# Patient Record
Sex: Female | Born: 1945 | Race: White | Hispanic: No | Marital: Married | Smoking: Never smoker
Health system: Southern US, Community
[De-identification: ages and names within clinical notes are randomized; demographics above are authoritative.]

## PROBLEM LIST (undated history)

## (undated) DIAGNOSIS — K649 Unspecified hemorrhoids: Secondary | ICD-10-CM

## (undated) DIAGNOSIS — T7840XA Allergy, unspecified, initial encounter: Secondary | ICD-10-CM

## (undated) DIAGNOSIS — E785 Hyperlipidemia, unspecified: Secondary | ICD-10-CM

## (undated) DIAGNOSIS — K219 Gastro-esophageal reflux disease without esophagitis: Secondary | ICD-10-CM

## (undated) DIAGNOSIS — I10 Essential (primary) hypertension: Secondary | ICD-10-CM

## (undated) DIAGNOSIS — C801 Malignant (primary) neoplasm, unspecified: Secondary | ICD-10-CM

## (undated) DIAGNOSIS — G4730 Sleep apnea, unspecified: Secondary | ICD-10-CM

## (undated) DIAGNOSIS — M8580 Other specified disorders of bone density and structure, unspecified site: Secondary | ICD-10-CM

## (undated) DIAGNOSIS — K5790 Diverticulosis of intestine, part unspecified, without perforation or abscess without bleeding: Secondary | ICD-10-CM

## (undated) DIAGNOSIS — R7303 Prediabetes: Secondary | ICD-10-CM

## (undated) DIAGNOSIS — M1990 Unspecified osteoarthritis, unspecified site: Secondary | ICD-10-CM

## (undated) HISTORY — DX: Gastro-esophageal reflux disease without esophagitis: K21.9

## (undated) HISTORY — PX: NASAL SEPTUM SURGERY: SHX37

## (undated) HISTORY — DX: Unspecified hemorrhoids: K64.9

## (undated) HISTORY — DX: Hyperlipidemia, unspecified: E78.5

## (undated) HISTORY — DX: Diverticulosis of intestine, part unspecified, without perforation or abscess without bleeding: K57.90

## (undated) HISTORY — DX: Sleep apnea, unspecified: G47.30

## (undated) HISTORY — DX: Allergy, unspecified, initial encounter: T78.40XA

## (undated) HISTORY — PX: ABDOMINAL HYSTERECTOMY: SUR658

## (undated) HISTORY — DX: Essential (primary) hypertension: I10

## (undated) HISTORY — DX: Unspecified osteoarthritis, unspecified site: M19.90

## (undated) HISTORY — DX: Other specified disorders of bone density and structure, unspecified site: M85.80

## (undated) HISTORY — PX: NASOPHARYNGEAL STENOSIS REPAIR: SHX2075

---

## 2002-10-23 ENCOUNTER — Other Ambulatory Visit: Admission: RE | Admit: 2002-10-23 | Discharge: 2002-10-23 | Payer: Self-pay | Admitting: *Deleted

## 2003-03-18 ENCOUNTER — Ambulatory Visit (HOSPITAL_COMMUNITY): Admission: RE | Admit: 2003-03-18 | Discharge: 2003-03-18 | Payer: Self-pay | Admitting: *Deleted

## 2003-05-24 ENCOUNTER — Encounter (INDEPENDENT_AMBULATORY_CARE_PROVIDER_SITE_OTHER): Payer: Self-pay | Admitting: Specialist

## 2003-05-24 ENCOUNTER — Inpatient Hospital Stay (HOSPITAL_COMMUNITY): Admission: RE | Admit: 2003-05-24 | Discharge: 2003-05-29 | Payer: Self-pay | Admitting: Surgery

## 2003-05-24 HISTORY — PX: OTHER SURGICAL HISTORY: SHX169

## 2007-07-24 DIAGNOSIS — K5790 Diverticulosis of intestine, part unspecified, without perforation or abscess without bleeding: Secondary | ICD-10-CM

## 2007-07-24 HISTORY — DX: Diverticulosis of intestine, part unspecified, without perforation or abscess without bleeding: K57.90

## 2007-07-24 HISTORY — PX: COLONOSCOPY: SHX174

## 2008-05-27 ENCOUNTER — Encounter: Payer: Self-pay | Admitting: Internal Medicine

## 2008-06-15 ENCOUNTER — Ambulatory Visit: Payer: Self-pay | Admitting: Internal Medicine

## 2008-06-30 ENCOUNTER — Ambulatory Visit: Payer: Self-pay | Admitting: Internal Medicine

## 2008-07-01 ENCOUNTER — Telehealth: Payer: Self-pay | Admitting: Internal Medicine
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
## 2010-12-08 NOTE — Discharge Summary (Signed)
NAMETRINE, FREAD                       ACCOUNT NO.:  [id number]   MEDICAL RECORD NO.:  [id number]                   PATIENT TYPE:  INP   LOCATION:  0379                                 FACILITY:  [facility]   PHYSICIAN:  Thornton Park. Daphine Deutscher, M.D.             DATE OF BIRTH:  1946-02-06   DATE OF ADMISSION:  05/24/2003  DATE OF DISCHARGE:  05/29/2003                                 DISCHARGE SUMMARY   ADMISSION DIAGNOSIS:  colovaginal fistula.   DISCHARGE DIAGNOSIS:  colovaginal fistula.   PROCEDURE:  Laparotomy, take down of colovaginal fistula, colon resection,  primary anastomosis.   HOSPITAL COURSE:  Lyan Holck was an a.m. admission and underwent the  above mentioned operation. She got along well and by postoperative day five  was doing great. She was taking food and passing gas and was eager to go  home. Her Jackson-Pratt drain which had been placed at the time of surgery  was removed. She was instructed to return to the office in about 2-3 weeks.   CONDITION ON DISCHARGE:  Improved.   FINAL DIAGNOSIS:  Status post take down of colovaginal fistula.                                               Thornton Park Daphine Deutscher, M.D.    MBM/MEDQ  D:  06/21/2003  T:  06/21/2003  Job:  [id number]   cc:   Lina Sar, M.D. [facility]   Pershing Cox, M.D.  [address]  [address]  [place]  [place] [address]  Fax: [phone]   Tinnie Gens ______________, Judie Petit.D.

## 2011-09-20 ENCOUNTER — Other Ambulatory Visit: Payer: Self-pay | Admitting: Dermatology

## 2011-12-03 ENCOUNTER — Encounter: Payer: Self-pay | Admitting: *Deleted

## 2013-06-29 ENCOUNTER — Ambulatory Visit (INDEPENDENT_AMBULATORY_CARE_PROVIDER_SITE_OTHER): Payer: Medicare Other | Admitting: Psychiatry

## 2013-06-29 ENCOUNTER — Encounter (INDEPENDENT_AMBULATORY_CARE_PROVIDER_SITE_OTHER): Payer: Self-pay

## 2013-06-29 DIAGNOSIS — F4323 Adjustment disorder with mixed anxiety and depressed mood: Secondary | ICD-10-CM

## 2013-06-29 DIAGNOSIS — F1020 Alcohol dependence, uncomplicated: Secondary | ICD-10-CM

## 2013-06-29 DIAGNOSIS — F1910 Other psychoactive substance abuse, uncomplicated: Secondary | ICD-10-CM

## 2013-06-29 DIAGNOSIS — F329 Major depressive disorder, single episode, unspecified: Secondary | ICD-10-CM

## 2013-06-30 ENCOUNTER — Encounter (HOSPITAL_COMMUNITY): Payer: Self-pay | Admitting: Psychiatry

## 2013-06-30 DIAGNOSIS — F1020 Alcohol dependence, uncomplicated: Secondary | ICD-10-CM | POA: Insufficient documentation

## 2013-06-30 DIAGNOSIS — F329 Major depressive disorder, single episode, unspecified: Secondary | ICD-10-CM | POA: Insufficient documentation
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
## 2013-07-09 ENCOUNTER — Encounter (HOSPITAL_COMMUNITY): Payer: Self-pay | Admitting: Psychology

## 2013-07-10 ENCOUNTER — Other Ambulatory Visit (HOSPITAL_COMMUNITY): Payer: Medicare Other | Attending: Psychiatry | Admitting: Psychology

## 2013-07-10 DIAGNOSIS — F1020 Alcohol dependence, uncomplicated: Secondary | ICD-10-CM | POA: Insufficient documentation

## 2013-07-12 ENCOUNTER — Encounter (HOSPITAL_COMMUNITY): Payer: Self-pay | Admitting: Psychology

## 2013-07-12 DIAGNOSIS — F10230 Alcohol dependence with withdrawal, uncomplicated: Secondary | ICD-10-CM | POA: Insufficient documentation

## 2013-07-12 NOTE — Progress Notes (Signed)
    Daily Group Progress Note  Program: CD-IOP   Group Time: 1-2:30 pm  Participation Level: Minimal  Behavioral Response: Appropriate and Sharing  Type of Therapy: Process Group  Topic: Group Process: the first part of group was spent in process./ members shared about current issues and concerns in early recovery. During this time, the medical director met with two new group members as well as three current group members about ongoing issues/medications/discharge.   Group Time: 2:45- 4pm  Participation Level: Minimal  Behavioral Response: Sharing  Type of Therapy: Psycho-education Group  Topic: Psycho-Ed: Self-Esteem/Graduation. The second half of group was spent in a psycho-ed session on self-esteem. The importance of living one's values was emphasized. An activity was also included where members wrote a word on a piece of paper taped to each group members' backs. Then they were asked to read the words their fellow members had written to describe them. At the end of the session,. A graduation ceremony was held honoring a member who is leaving. It was an intense group and the ending proved very heartfelt with a fond farewell from the members who remain.   Summary: The patient was new to the group and was quite, but very attentive in the first half of group. She agreed to introduce herself briefly in the second half of group. She described herself as having been an alcoholic for a number of years. The patient noted her husband drinks more than she does. She got teary as she described her husband's demeaning words towards her, especially in the evenings, after he has been drinking. She reported her mother had died of cirrhosis at age 38. The patient read the words her new group members had written and they included: beautiful, brave, and kind. She admitted that she had enjoyed the session and although she is excused from [facility] group due to a previous commitment, she will be back on  Wednesday morning. The new patient's sobriety date is 12/18.    Family Program: Family present? No   Name of family member(s):   UDS collected: No Results:  AA/NA attended?: No, not recently, but she had attended a meeting years ago - "full of old men"  Sponsor?: No, she is new to the program   Ziyana Morikawa, LCAS

## 2013-07-13 ENCOUNTER — Other Ambulatory Visit (HOSPITAL_COMMUNITY): Payer: Medicare Other

## 2013-07-13 ENCOUNTER — Encounter (HOSPITAL_COMMUNITY): Payer: Self-pay | Admitting: Psychology

## 2013-07-14 NOTE — Progress Notes (Unsigned)
Patient ID: [name], female   DOB: [birth year], 67 y.o.   MRN: [id number] CD-IOP: The patient did not appear for group today. She had told me on Friday that she had already committed to baby sitting her grandchildren while her daughter does some Christmas shopping. She will be excused from group because of this commitment planned before she had ever sought treatment for her drinking.
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
## 2013-07-15 ENCOUNTER — Other Ambulatory Visit (HOSPITAL_COMMUNITY): Payer: Medicare Other | Admitting: Psychology

## 2013-07-15 DIAGNOSIS — F1020 Alcohol dependence, uncomplicated: Secondary | ICD-10-CM
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
## 2013-07-17 ENCOUNTER — Other Ambulatory Visit (HOSPITAL_COMMUNITY): Payer: Medicare Other

## 2013-07-18 ENCOUNTER — Encounter (HOSPITAL_COMMUNITY): Payer: Self-pay | Admitting: Psychology
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
## 2013-07-20 ENCOUNTER — Other Ambulatory Visit (HOSPITAL_COMMUNITY): Payer: Medicare Other | Admitting: Psychology

## 2013-07-20 DIAGNOSIS — F329 Major depressive disorder, single episode, unspecified: Secondary | ICD-10-CM

## 2013-07-20 DIAGNOSIS — F1020 Alcohol dependence, uncomplicated: Secondary | ICD-10-CM

## 2013-07-22 ENCOUNTER — Encounter (HOSPITAL_COMMUNITY): Payer: Self-pay | Admitting: Psychology

## 2013-07-22 ENCOUNTER — Other Ambulatory Visit (HOSPITAL_COMMUNITY): Payer: Medicare Other | Admitting: Psychology

## 2013-07-22 DIAGNOSIS — F1020 Alcohol dependence, uncomplicated: Secondary | ICD-10-CM

## 2013-07-22 DIAGNOSIS — F329 Major depressive disorder, single episode, unspecified: Secondary | ICD-10-CM

## 2013-07-22 NOTE — Progress Notes (Signed)
    Daily Group Progress Note  Program: CD-IOP   Group Time: 1-2:30 pm  Participation Level: Minimal  Behavioral Response: Appropriate and Sharing  Type of Therapy: Process Group  Topic: Group Process; the first part off group was spent in process. Members shared about the past holiday weekend and any issues or concerns they may have had in early recovery. One member admitted he had taken a percocet on Friday after spending the previous few days helping his daughter move into her new home. He pointed out that some people with chronic pain didn't consider taking a prescription medication properly a problem. He was very resistant and made many excuses. The group said little to challenge this member. Other than this member, everyone else reported their same sobriety date. Three members were absent from the group.   Group Time: 2:45- 4pm  Participation Level: Minimal  Behavioral Response: Sharing  Type of Therapy: Psycho-education Group  Topic:The Neurobiology of Addiction: the second half of group was spent in a psycho-ed session about brain chemistry and addiction. Members watched a brief film by a Well[employer] geologist of the [place]. In detailed, yet simple terms, he described what happens in the addict's brain as a result of drug use. The session proved very informative and members agreed that it made sense and helped them better understand their own struggles and journey into addiction. Today, drug tests were collected for all group members present.   Summary: The patient reported she had had a good holiday. Her children and their families had come to the house on Friday, because the young children like to be home on Christmas morning for [place]. She admitted that she had told her husband to "knock it off" after he had drunk 3 gin and tonics. The patient had reported in previous sessions that her husband has too much to drink and then he gets mean or angry and there have been verbal  altercations in the past. He did stop drinking after she said something to him and the dinner went well. When I questioned her, she admitted that her son-in-law didn't come to the house. He became estranged from the family after a run-in with her husband 3 years ago. As a result, he does not come to their home any longer. The patient admitted it is sad and awkward for her. She reported she did not have any cravings, but felt uncomfortable when her husband continues to drink in front of her. Her sobriety date remains 12/18.   Family Program: Family present? No   Name of family member(s):   UDS collected: Yes Results: results not back yet  AA/NA attended?: YesSaturday  Sponsor?: No   Declynn Lopresti, LCAS

## 2013-07-24 ENCOUNTER — Encounter (HOSPITAL_COMMUNITY): Payer: Self-pay | Admitting: Psychology

## 2013-07-24 ENCOUNTER — Other Ambulatory Visit (HOSPITAL_COMMUNITY): Payer: Medicare Other | Attending: Psychiatry | Admitting: Psychology

## 2013-07-24 DIAGNOSIS — F1020 Alcohol dependence, uncomplicated: Secondary | ICD-10-CM

## 2013-07-24 DIAGNOSIS — F32A Depression, unspecified: Secondary | ICD-10-CM

## 2013-07-24 DIAGNOSIS — F329 Major depressive disorder, single episode, unspecified: Secondary | ICD-10-CM

## 2013-07-24 NOTE — Progress Notes (Signed)
    Daily Group Progress Note  Program: CD-IOP   Group Time: 1-2:30 pm  Participation Level: Active  Behavioral Response: Sharing Type of Therapy: Psycho-education Group  Topic: Psycho-Ed: "Relapse Prevention Model". the first half of group was spent in a psycho-ed session. After completion of the check-in, members were provided a handout and the model of relapse prevention was provided. Members provided feedback as the handout was discussed. During this time, the medical director met with two members for discharge and another new group member. The session invited good discussion and feedback.   Group Time: 2:45- 4pm  Participation Level: Minimal  Behavioral Response: Sharing  Type of Therapy: Process Group  Topic: Psycho-Ed/Guided Relaxation/Graduation. The second half of group was spent in process. Members shared about current issues and concerns. During this session, a passive progressive relaxation exercise was read by this Probation officer while group members participated in the exercise. All members agreed that they had experienced a significant degree of relaxation and serenity. As the session came to a close, a graduation ceremony was held for a member successfully completing the program. There were kind words shared among the group for this admired member. She accepted their words with grace and appreciation. It was a powerful ending to this session.   Summary: The patient reported she had gone to [place] to celebrate the New Year with her husband a good long time friends. She noted she had passed on the [facility] and didn't experience any problems or questions because she hadn't drank anything. The patient reported she found that it was 'nice'. She admitted that she hasn't said anything to these women friends, but will take the opportunity to do that sometime . in the future. The patient attended the [place] meeting this morning and it proved, once again, very powerful. The patient reported  she had found this guided relaxation exercise very relaxing and she almost fell asleep. The patient reported she would continues to remain sober and is committed to her recovery. Her sobriety date remains 12/18.Chloe  Family Program: Family present? No   Name of family member(s):   UDS collected: No Results:   AA/NA attended?: YesFriday  Sponsor?: No, but she is new to the program   Rinnah Peppel, LCAS

## 2013-07-24 NOTE — Progress Notes (Signed)
    Daily Group Progress Note  Program: CD-IOP   Group Time: 1-2:30 pm  Participation Level: Active  Behavioral Response: Appropriate and Sharing  Type of Therapy: Process Group  Topic: Group Process: The first half of group was spent in process. Members shared about current issues and concerns in early recovery. A new group member was present and she introduced herself and shared about her struggles with alcohol. Also in included in this half of group was a handout and discussion on "Common Group Behaviors". The group read each of the 20 different behaviors in group that need to be confronted and challenged. It was helpful and invited members to call out other group members who are avoiding the truth in one way or another.   Group Time: 2:45- 4pm  Participation Level: Active  Behavioral Response: Sharing  Type of Therapy: Psycho-education Group  Topic: "Daily Inventory": the second half of group was spent in a psycho-ed on taking a daily inventory. Included was a handout listing personality characteristics of self-will or the addictive-self versus personality characteristics of one's HP will. Group members shared what they need to work on, including addressing the negative characteristics of their active addiction. They also shared the more desirable characteristics that they intend to focus more on in the coming days. The session proved very helpful and invited reflection and sharing. As the session ended, each member shared their plans for New Year's Eve and committed to what they will do tomorrow prior to the next group session.   Summary: The patient reported she had had a 'great experience' yesterday. She had attended an [place] meeting on Tuesday morning and collected some phone numbers from a number of the women at the meeting. The patient shared that she had called one of the women because she had been instructed to begin calling others by her counselor. The woman answered and admitted  that this phone call was the first one she had ever received from another woman in recovery despite being in [place] for 5 years. The patient reported they had spoken for quite a while and this other woman told her she would introduce her to some other women who shared similar stories. The patient was thrilled and had also attended the 10 am AA meeting today before group and it had been really good. In part two, the patient identified wanting to work on her "self-condemnation" and seek out the willingness to forgive herself. The patient is doing very well in early recovery and despite being uncomfortable, she has reached out as instructed and found it incredibly rewarding. Her sobriety date is 12/18.    Family Program: Family present? No   Name of family member(s):   UDS collected: No Results:  AA/NA attended?: YesTuesday and Wednesday  Sponsor?: No   Nieves Barberi, LCAS

## 2013-07-27 ENCOUNTER — Other Ambulatory Visit (HOSPITAL_COMMUNITY): Payer: Medicare Other | Admitting: Psychology

## 2013-07-27 DIAGNOSIS — F1020 Alcohol dependence, uncomplicated: Secondary | ICD-10-CM

## 2013-07-27 DIAGNOSIS — F32A Depression, unspecified: Secondary | ICD-10-CM

## 2013-07-27 DIAGNOSIS — F329 Major depressive disorder, single episode, unspecified: Secondary | ICD-10-CM

## 2013-07-28 ENCOUNTER — Encounter (HOSPITAL_COMMUNITY): Payer: Self-pay | Admitting: Psychology

## 2013-07-28 NOTE — Progress Notes (Signed)
    Daily Group Progress Note  Program: CD-IOP   Group Time: 1-2:30 pm  Participation Level: Minimal  Behavioral Response: Sharing  Type of Therapy: Process Group  Topic: Group Process: The first part of group was spent in process. Members shared about the past weekend and the things they had done to support their recovery. Almost everyone had attended a meeting and three members reported having secured a new sponsor. There was good disclosure and feedback among group members. During this group session, a drug test was collected from all of the group members.   Group Time: 2:45- 4pm  Participation Level: Minimal  Behavioral Response: Sharing  Type of Therapy: Psycho-education Group  Topic: Psycho-Ed/Graduation: the second half of group was spent in a psycho-ed session on one's "Person to Person Map". The handout consisted of a circle and members were asked to place themselves in the middle of it and then identify all of the people in their lives. Their closeness and size represented the importance each person holds for the patient. Each member then drew their circle on the board. As the session neared an end, a graduation ceremony was held to honor a member who was leaving the program. The medallion was passed around with members sharing their thoughts and hopes for the successfully graduating member. She is a highly regarded member who has made great progress and she urged the remaining members to be open and honest if they are to benefit from this program.   Summary: The patient reported she had not attended any meetings over the weekend, but she had gone to the [place] meeting at 10 am this morning. She reported she had not had any cravings. When she drew her wheel, the patient invited questions from other members. She admitted that despite a number of women friends, she had not told anyone that she was in this program. This included her two adult children. The patient, when questioned  further, admitted she didn't tell anyone because of being ashamed and embarrassed. Other members wondered if they wouldn't admit they had known if she told them, but the patient admitted she doesn't drink much around those same women. I pointed out she was much like her mother - a 'closet drinker' who died of cirrhosis. The patient seemed a little surprised by this comparison, but it was clearly accurate. I also reminded the group of the problems her husband had had with their son-in-law. They have had a problem now for almost 4 years and neither has entered the other's home in that same time. The patient admitted it was sad and her 42 yo daughter had wondered over Christmas why, "Daddy is not here"? after reviewing her map, it is clear that this woman is very closed and private with no one in her life really knowing what is going on with her. At age 68, this seemed very sad. The patient's sobriety date remains 12/18.    Family Program: Family present? No   Name of family member(s):   UDS collected: Yes Results: not back yet  AA/NA attended?: YesMonday  Sponsor?: No   Raeya Merritts, LCAS

## 2013-07-29 ENCOUNTER — Other Ambulatory Visit (HOSPITAL_COMMUNITY): Payer: Medicare Other | Admitting: Psychology

## 2013-07-29 DIAGNOSIS — F1020 Alcohol dependence, uncomplicated: Secondary | ICD-10-CM

## 2013-07-31 ENCOUNTER — Other Ambulatory Visit (HOSPITAL_COMMUNITY): Payer: Medicare Other

## 2013-08-03 ENCOUNTER — Other Ambulatory Visit (HOSPITAL_COMMUNITY): Payer: Medicare Other | Admitting: Psychology

## 2013-08-03 DIAGNOSIS — F1020 Alcohol dependence, uncomplicated: Secondary | ICD-10-CM | POA: Diagnosis not present

## 2013-08-03 DIAGNOSIS — F329 Major depressive disorder, single episode, unspecified: Secondary | ICD-10-CM

## 2013-08-03 DIAGNOSIS — F32A Depression, unspecified: Secondary | ICD-10-CM

## 2013-08-04 ENCOUNTER — Encounter (HOSPITAL_COMMUNITY): Payer: Self-pay | Admitting: Psychology

## 2013-08-04 NOTE — Progress Notes (Signed)
    Daily Group Progress Note  Program: CD-IOP   Group Time: 1-2:30 pm  Participation Level: Minimal  Behavioral Response: Appropriate and Sharing  Type of Therapy: Process Group  Topic: Group Process: the first part of group was spent in process. Members shared about current issues and concerns in early recovery. They were asked to disclose what they had done over the weekend to support their sobriety. There was good disclosure among the group and discussion and feedback about the issues. During the group session today, drug tests were collected from each group member.   Group Time: 2:45- 4pm  Participation Level: Active  Behavioral Response: Appropriate and Sharing  Type of Therapy: Psycho-education Group  Topic: Serenity Prayer/Graduation: the second part of group was spent in a psycho-ed session on the serenity prayer. A handout was provided asking members to identify 5 things they could not change and 5 things that they could change. The members then shared some of their answers with the group. The importance of focusing on the things that one can change was reiterated. At the end of the session, the graduation ceremony was held, brownies and passing around the graduation medallion occurred. Kind words and well wishes were given to the member leaving the program and she encouraged her fellow group members to work hard and be honest and they can change their lives. The patient leaves having attained 9+ months of total sobriety.   Summary: The patient reported she had had a good visit with her son and his family in [place] over the weekend. She had gone to care for the young granddaughters because their mother was out of town. The patient reported she had disclosed with both her children that she was enrolled in this program and addressing her problem with alcohol. She admitted that they were very pleased with this news and very validating towards her. The patient was surprised by how  well the news was taken and surprised because she had been hesitant to sharing with them. The patient admitted to the group, "it felt good to tell them". She agreed that there was less pressure on her and she felt more 'free' now that the truth is out. The patient reported she cannot change what others think of her, but she can take care of and change herself. She continues to make good progress and her sobriety date remains 12/18.   Family Program: Family present? No   Name of family member(s):   UDS collected: Yes Results:not back from lab yet  AA/NA attended?: YesMonday  Sponsor?: No   Hilja Kintzel, LCAS

## 2013-08-04 NOTE — Progress Notes (Unsigned)
    Daily Group Progress Note  Program: CD-IOP   Group Time: 1:00-2:30pm  Participation Level: Active  Behavioral Response: Appropriate  Type of Therapy: Process Group  Topic: the first half of group was spent in process. Upon the completion of check-in, members discussed issues and concerns in early recovery. They recounted what they had done to support their recovery since the last group session. The results of drug tests were returned with no surprises. A new member was present and she introduced herself and recounted her relapses after 23 years of sobriety. The session was intense and members very attentive to this new group member's disclosures.     Group Time: 2:45-4:00pm  Participation Level: Active  Behavioral Response: Appropriate  Type of Therapy: Psycho-education Group  Topic:  the second half of group was spent watching a portion of a DVD followed up by a group discussion. Members watched a movie about an alcoholic family and the dysfunction that envelops all of the members, including ones that don't even use. The film was tragic for the pain and damage done to the family's emotional sensibilities. The ensuing discussion was poignant with everyone sharing what they had felt about watching this disaster unfold.    Summary: The patient reported she had gone to the [place] meeting today at 10 am. It had been very powerful and she admitted she really enjoys going to the meetings now. The patient agreed that she must reach out to others, despite the discomfort of that. After watching the film, the patient reported she could relate to some of the experiences and felt badly about what her drinking might have done to her own children. The patient remains somewhat distant and withdrawn, but she has acknowledged the need to become more transparent and share about herself. Her sobriety date is 12/18.   Family Program: Family present? No   Name of family member(s): n/a  UDS collected:  No Results: n/a  AA/NA attended?: YesWednesday  Sponsor?: No   [facility]

## 2013-08-05 ENCOUNTER — Other Ambulatory Visit (HOSPITAL_COMMUNITY): Payer: Medicare Other | Admitting: Psychology

## 2013-08-05 DIAGNOSIS — F10230 Alcohol dependence with withdrawal, uncomplicated: Secondary | ICD-10-CM

## 2013-08-05 DIAGNOSIS — F1020 Alcohol dependence, uncomplicated: Secondary | ICD-10-CM | POA: Diagnosis not present

## 2013-08-07 ENCOUNTER — Other Ambulatory Visit (HOSPITAL_COMMUNITY): Payer: Medicare Other | Admitting: Psychology

## 2013-08-07 ENCOUNTER — Encounter (HOSPITAL_COMMUNITY): Payer: Self-pay | Admitting: Psychology

## 2013-08-07 DIAGNOSIS — F10230 Alcohol dependence with withdrawal, uncomplicated: Secondary | ICD-10-CM

## 2013-08-07 DIAGNOSIS — F1020 Alcohol dependence, uncomplicated: Secondary | ICD-10-CM | POA: Diagnosis not present

## 2013-08-07 NOTE — Progress Notes (Signed)
    Daily Group Progress Note  Program: CD-IOP   Group Time: 1:00-2:30pm  Participation Level: Minimal  Behavioral Response: Appropriate  Type of Therapy: Process Group  Topic: The group checked in by sharing their names and sobriety dates and were given the opportunity to briefly share how they were doing that day. At 1:30 a visitor from the [employer], [place], arrived and presented for about an hour.  She shared a handout of quotes about transcendence and had the group members share their thoughts about transcendence.  She also discussed suffering and helped them understand how transcendent experiences can help them overcome suffering.  She also explained that transcendent experiences do not have to come from a particular god or religious tradition, but can come from things such as nature and music.  Several group members shared about their experiences with transcendence, identifying a wide variety of sources of transcendence, including religion, nature, and AA/NA meetings.     Group Time: 2:45-4:00pm  Participation Level: Minimal  Behavioral Response: Appropriate  Type of Therapy: Psycho-education Group  Topic: Counselor led the group in a continued discussion of the topic from process group, asking that all group members share something they gained or learned from Alexis's presentation.  Several group members expressed that they had enjoyed the discussion and felt they had gained a lot from it.  Some group members shared that the conversation served as a reminder about how important spiritual experiences are, and said that they felt more committed to seeking them out.  The discussion then moved to other topics, and several group members shared about personal and family issues that were affecting them.  Counselor closed the group by having a group member read the thought for the day.   Summary: Patient reported a sobriety date of 12/18.  She reported that to complete  her assignment from her individual session, she had called 3 people whose numbers she received in meetings.  Although no one answered she left messages.  During the chaplain's presentation, she shared that she experiences transcendence at the ocean.  She also shared that she started a new devotional to support her spiritual life.  Patient was quiet and withdrawn throughout the group session, only speaking when prompted to.  Although she is making some progress, she seems unwilling to share with the group yet.   Family Program: Family present? No   Name of family member(s): n/a  UDS collected: Yes Results:not yet available  AA/NA attended?: YesMonday  Sponsor?: Yes   [facility]

## 2013-08-10 ENCOUNTER — Other Ambulatory Visit (HOSPITAL_COMMUNITY): Payer: Medicare Other | Admitting: Psychology

## 2013-08-10 DIAGNOSIS — F1020 Alcohol dependence, uncomplicated: Secondary | ICD-10-CM

## 2013-08-11 ENCOUNTER — Encounter (HOSPITAL_COMMUNITY): Payer: Self-pay | Admitting: Psychology

## 2013-08-11 NOTE — Progress Notes (Signed)
    Daily Group Progress Note  Program: CD-IOP   Group Time: 1:00-2:30pm  Participation Level: Minimal  Behavioral Response: Appropriate  Type of Therapy: Process Group  Topic: Stress: What Stresses You Out and How does it Effect You? the first half group was spent in process. Members shared about current issues and concerns in recovery. Upon competing the check-on, a discussion followed on the topic of Stress. Members identified what stressors they experience in their lives and how they are effected by these stressors. There were many different examples provided and group members identified many of the same stressors. During the session today, the program director met with two new group members and also spoke with two current members about medication concerns.     Group Time: 2:45-4:00pm  Participation Level: Active  Behavioral Response: Appropriate  Type of Therapy: Psycho-education Group  Topic: Heart Math: the second half of group was spent in a psycho-ed presentation on Heart Math. Members were provided with a handout and explanation on the basic concepts behind this relaxation practice. Afterwards, members volunteered to 'hook up' and use the computer program to practice their breathing in sequence with the program . The session was very transformative for a  number of group members and the session ended with members excited about this new tool.    Summary: The patient reported she had picked up her 30-day chip today and the group applauded this good news. She followed it with news that she had gotten a sponsor. She had gone to lunch with some gals after the meeting and one of the women had asked her about whether she had a sponsor and had offered to be hers since she didn't have one. The patient reported she is supposed to phone her tonight and will work on her accountability. She was very pleased to share this good news. The patient identified her husband as a source of stress  and noted she doesn't sleep and struggles with depression. She followed the instructions and agreed that it seemed more peaceful, but she did not volunteer to go up and plug in with the Heart Math monitor. She made some good comments and responded well to this intervention. Her sobriety date remains 12/18.   Family Program: Family present? No   Name of family member(s): n/a  UDS collected: No Results: n/a  AA/NA attended?: YesThursday  Sponsor?: Yes   Taige Housman, LCAS

## 2013-08-11 NOTE — Progress Notes (Unsigned)
    Daily Group Progress Note  Program: CD-IOP   Group Time: 1:00-2:30pm  Participation Level: Active  Behavioral Response: Appropriate  Type of Therapy: Process Group  Topic: first part of group was spent in process. Members shared about the past weekend and any issues or concerns in early recovery. Members talked about relationship issues, family problems, feelings of guilt and cravings. A new member was present and the group welcomed her. A PA student from [employer] [facility] [employer] was also in group today and she was engaged in the session and provided good information when asked. Drug tests were collected from all group members today.      Group Time: 2:45-4:00pm  Participation Level: Minimal  Behavioral Response: Appropriate  Type of Therapy: Psycho-education Group  Topic: Relapse: The 4-Step Process; the second half of group was spent in a psycho-ed session. One member shared about shopping at a local grocery store and then being very tempted to walk directly to the nearby [facility] store. She described thinking how it would be nice to have a drink and quickly thereafter, she experienced a powerful craving and stated, "I could taste the vodka". An explanation of the relapse process was reviewed and members provided good comments and feedback as the process was discussed. The session was very informative and everyone agreed they could easily relate to the process.    Summary: The patient reported she had spent the weekend with her husband. She noted that he had phoned her sponsor on Friday evening  the patient explained that her sponsor is going to be out of state for a while and I encouraged her to make contact with others so she doesn't feel lonely or not have resources to contact in case she is struggling. When asked if her husband didn't want her to leave on weekends, she admitted that he liked her at home. In the second half of group, the patient was asked to identify an 'internal' trigger.  She pondered this thought for a moment and admitted an internal trigger was anxiety. Later in the conversation, the patient asked what one does if her husband is a trigger? The group encouraged her to focus on herself and the changes she needs to make. The patient shared little else of herself. Her sobriety date is 12/18.    Family Program: Family present? No   Name of family member(s): n/a  UDS collected: Yes Results: not yet available  AA/NA attended?: No  Sponsor?: Yes   Sheliah Fiorillo, LCAS

## 2013-08-12 ENCOUNTER — Other Ambulatory Visit (HOSPITAL_COMMUNITY): Payer: Medicare Other | Admitting: Psychology

## 2013-08-12 DIAGNOSIS — F32A Depression, unspecified: Secondary | ICD-10-CM

## 2013-08-12 DIAGNOSIS — F1020 Alcohol dependence, uncomplicated: Secondary | ICD-10-CM | POA: Diagnosis not present

## 2013-08-12 DIAGNOSIS — F329 Major depressive disorder, single episode, unspecified: Secondary | ICD-10-CM

## 2013-08-14 ENCOUNTER — Encounter (HOSPITAL_COMMUNITY): Payer: Self-pay | Admitting: Psychology

## 2013-08-14 ENCOUNTER — Other Ambulatory Visit (HOSPITAL_COMMUNITY): Payer: Medicare Other | Admitting: Psychology

## 2013-08-14 DIAGNOSIS — F32A Depression, unspecified: Secondary | ICD-10-CM

## 2013-08-14 DIAGNOSIS — F329 Major depressive disorder, single episode, unspecified: Secondary | ICD-10-CM

## 2013-08-14 DIAGNOSIS — F1020 Alcohol dependence, uncomplicated: Secondary | ICD-10-CM | POA: Diagnosis not present

## 2013-08-14 NOTE — Progress Notes (Signed)
    Daily Group Progress Note  Program: CD-IOP   Group Time: 1-2:30 pm  Participation Level: Minimal  Behavioral Response: Sharing  Type of Therapy: Process Group  Topic: Patients checked in by sharing their names and sobriety dates and reported the recovery activities they had done since the last group session.  They also shared concerns and difficulties related to being in early recovery and offered support for one another.  The counselor reviewed the 4-step relapse process and group members discussed actions they can take to distract themselves from thoughts about using.  Group members also offered feedback for one patient who reported that she was "doing nothing" for her recovery.  Group Time: 2:45- 4pm  Participation Level: Active  Behavioral Response: Appropriate and Sharing  Type of Therapy: Psycho-education Group  Topic:Counseling intern led a presentation on wellness.  Group members discussed the definition of wellness including key points such as wellness as a lifestyle and wellness choices.  They then rated their own current level of wellness, and several group members explained the factors that contributed to their ratings.  Counseling intern provided a handout on Daily Maintenance Plans and led group members through the handout.  They discussed the differences in themselves when they are well v. unwell, activities they need to do daily to stay well, and activities they can do occasionally when they need to restore their wellness.   Summary: Patient reported a sobriety date of 12/18.  She shared that she had told her doctor about her alcoholism at a recent appointment, and was pleased by her doctor's support and validation.  She also attended an [place] meeting the day before, as well as the funeral of a friend.  She shared that her friend was a very proactive person, which was inspiring to her.  During the psychoeducation activities, she shared that her current level of wellness  was in the middle, with a lot of room for improvement.  She shared that preparing healthier dinners was a step she could take to improve her wellness.   Family Program: Family present? No   Name of family member(s):   UDS collected: No Results:   AA/NA attended?: YesMonday, Tuesday and Wednesday  Sponsor?: Yes, but sponsor is out of town, patient has been encouraged to seek temporary guidance from someone in the program   Breckin Savannah, LCAS

## 2013-08-15 ENCOUNTER — Encounter (HOSPITAL_COMMUNITY): Payer: Self-pay | Admitting: Psychology

## 2013-08-15 NOTE — Progress Notes (Signed)
    Daily Group Progress Note  Program: CD-IOP   Group Time: 1-2:30 pm  Participation Level: Active  Behavioral Response: Appropriate and Sharing  Type of Therapy: Process Group  Topic: Group Process/Heart Math: the first part of group was spent checking-in, a 20 minute Heart Math review, and, finally, process. All members present reported ongoing abstinence since the last group. During this time the program director met with the new group member and two others who had questions about current medications.  During the Heart Math demonstrations, two members 'hooked up' and practiced their breathing with one of the programs available on the computer system. During the check-in, there were some good observations and questions, members provided good feedback and the session went well.   Group Time: 2:45- 4pm  Participation Level: Active  Behavioral Response: Sharing  Type of Therapy: Psycho-education Group  Topic: Psycho-Ed: Substance Dependence Criteria. The second half of group was spent in a psycho ed presentation on the criteria that are necessary for a substance dependency diagnosis. A handout was provided with the criteria as identified in the DSM V and members took time reading each criteria with a subsequent discussion with examples following each of the criteria. Every member except one admitted that they met all of the 11 criteria. The other member admitted she needed to think about it for a while. The session proved very effective and the feedback and honesty displayed among members was very open.   Summary: The patient reported she had attended two meetings since the last group session. She had also phoned one of the former group members who had planned on returning this past week, but didn't. She also shared with the new group member about the stress and pressures of planning a daughter's wedding. She reminded her that they had hired a Catering manager and she should let her do her  job and enjoy this time instead of getting into arguments and stressing about it. The patient reported she had purchases a journal and was going to begin writing in it every day. It will be a wonderful outlet and help her see things more clearly. In reviewing the criteria for dependency, the patient admitted that she met almost every one of those listed. She provided good feedback and is opening up and reaching out more since she first began the program. Her sobriety date remains 12/18.    Family Program: Family present? No   Name of family member(s):   UDS collected: No Results:   AA/NA attended?: YesThursday and Friday  Sponsor?: Yes   Avan Gullett, LCAS

## 2013-08-17 ENCOUNTER — Other Ambulatory Visit (HOSPITAL_COMMUNITY): Payer: Medicare Other | Admitting: Psychology

## 2013-08-17 DIAGNOSIS — F1020 Alcohol dependence, uncomplicated: Secondary | ICD-10-CM

## 2013-08-17 DIAGNOSIS — F32A Depression, unspecified: Secondary | ICD-10-CM

## 2013-08-17 DIAGNOSIS — F329 Major depressive disorder, single episode, unspecified: Secondary | ICD-10-CM

## 2013-08-19 ENCOUNTER — Encounter (HOSPITAL_COMMUNITY): Payer: Self-pay | Admitting: Psychology

## 2013-08-19 ENCOUNTER — Other Ambulatory Visit (HOSPITAL_COMMUNITY): Payer: Medicare Other | Admitting: Psychology

## 2013-08-19 DIAGNOSIS — F1020 Alcohol dependence, uncomplicated: Secondary | ICD-10-CM | POA: Diagnosis not present

## 2013-08-19 DIAGNOSIS — F32A Depression, unspecified: Secondary | ICD-10-CM

## 2013-08-19 DIAGNOSIS — F329 Major depressive disorder, single episode, unspecified: Secondary | ICD-10-CM

## 2013-08-19 NOTE — Progress Notes (Signed)
    Daily Group Progress Note  Program: CD-IOP   Group Time: 1-2:30 pm  Participation Level: Active  Behavioral Response: Appropriate and Sharing  Type of Therapy: Process Group  Topic:Group Process. The first half of group was spent in process. Members shared bout the past weekend and any issues or concerns that had come  up for them. The daily reflection dealt with not getting pulled into the fray with toxic people. Most of the group members could easily relate to this. There was good discussion and disclosure among members. During this session, drug tests were collected from all of the members.   Group Time: 2:45- 4pm Participation Level: Minimal  Behavioral Response: Sharing  Type of Therapy: Psycho-education Group  Topic: Family Sculpture; the second half of group was spent in an activity that addressed one's early life and the family dynamics they were raised in. Members were asked to 'sculpt' their families using other group members to represent each of their family members, including themselves. The group provided feedback and asked questions as each sculpture was presented. The activity evoked powerful memories and emotions and the two members who sculpted their families shared feelings and experiences that had not previously been disclosed. The group bonded and became closer as a result of this activity. It was a therapeutic and healing opportunity for the members who sculpted their families.   Summary: The patient reported she had a good weekend. She had not attended any meetings this weekend and spent most of her time with her husband. They had gone to a movie on Saturday and attended church on Sunday. The patient had spoken with her sponsor, though, and was planning on meeting with her on Wednesday after the meeting at 10 am. This is good news as she hasn't really 'worked' with her sponsor and she was instructed to bring the [place] and her 89 and 14. The patient provided good  feedback to other group members in process. She participated as a sculpted member in the second half of group and shared her feelings. The patient continues to make good progress and her sobriety date remains 12/18.   Family Program: Family present? No   Name of family member(s):   UDS collected: Yes Results: negative  AA/NA attended?: YesMonday  Sponsor?: Yes   Thong Feeny, LCAS

## 2013-08-20 ENCOUNTER — Encounter (HOSPITAL_COMMUNITY): Payer: Self-pay | Admitting: Psychology

## 2013-08-20 NOTE — Progress Notes (Signed)
    Daily Group Progress Note  Program: CD-IOP   Group Time: 1-2:30 pm  Participation Level: Minimal  Behavioral Response: Appropriate  Type of Therapy: Psycho-education Group  Topic: Psycho-Ed; The first half of group was spent with a visiting speaker. A gentleman who has attained 10 years of sobriety and active in [place] came to the group to tell his story. Group members were respectful and attentive as this man talked about his life, addiction, and the struggles he has faced in recovery. He also talked about how Al-An[employer] has changed his life in helping him deal with owning his stuff and letting others assume responsibility for themselves. The session was powerful and compelling.   Group Time: 2:45- 4pm  Participation Level: Minimal  Behavioral Response: Sharing  Type of Therapy: Process Group  Topic: Group Process; The second half of group was spent in process. Members shared about current issues and concerns. A new group member was present and he was asked to introduce himself. Members were attentive as this young man shared his story. It seemed clear that he belonged in this program. The session proved effective and many of the members talked about the powerful speaker and session.   Summary: The patient checked-in and shared that she had met with her sponsor after the [place] meeting today. They had a good time and she was receptive to the recommendations her sponsor made and the beginning of their work together. She was attentive during the presentation and noted that she had benefited from hearing the guest's story, especially around what he has learned about his addicted mother and the help through Al-Anon. The patient reported she heard his message about 'boundaries' and would determine how that would apply to her own life. When asked about her journaling, the patient shared that she had prepared to sit down and begin writing a letter to her father, but her husband came home early and  surprised her. She decided to hold off until later this week when she is confident she will not be interrupted and have time to grieve. The patient made some good comments and responded well to this intervention. Her sobriety date remains 12/18.   Family Program: Family present? No   Name of family member(s):   UDS collected: No Results:   AA/NA attended?: YesTuesday and Wednesday  Sponsor?: Yes   Rigel Filsinger, LCAS

## 2013-08-21 ENCOUNTER — Other Ambulatory Visit (HOSPITAL_COMMUNITY): Payer: Medicare Other

## 2013-08-24 ENCOUNTER — Other Ambulatory Visit (HOSPITAL_COMMUNITY): Payer: Medicare Other | Attending: Psychiatry | Admitting: Psychology

## 2013-08-24 DIAGNOSIS — F1020 Alcohol dependence, uncomplicated: Secondary | ICD-10-CM

## 2013-08-24 DIAGNOSIS — F329 Major depressive disorder, single episode, unspecified: Secondary | ICD-10-CM

## 2013-08-24 DIAGNOSIS — F32A Depression, unspecified: Secondary | ICD-10-CM

## 2013-08-25 ENCOUNTER — Encounter (HOSPITAL_COMMUNITY): Payer: Self-pay | Admitting: Psychology

## 2013-08-25 NOTE — Progress Notes (Signed)
    Daily Group Progress Note  Program: CD-IOP   Group Time: 1-2:30 pm  Participation Level: Active  Behavioral Response: Appropriate and Sharing  Type of Therapy: Process Group  Topic: Group Process: the fist part of group was spent in process. Members shared about the past weekend and any thoughts, issues, or challenges that may have been difficult for ongoing sobriety were discussed. Also disclosed were meetings attended, conversations with sponsor or others, step work or anything else that supported sobriety. A new group members was present and she was quiet, but attentive. The weekend proved productive for many group members and everyone remained abstinent. Drug tests were collected from at least 5 members.   Group Time: 2:45- 4pm  Participation Level: Active  Behavioral Response: Appropriate  Type of Therapy: Psycho-education Group  Topic: Dopamine Deficiency and the power of Emotional Memories. A pscyho-ed presentation was made in the second half of group. This biologically-based disease of addiction was described as a dopamine deficiency disease occurring in one's brain. The research was discussed and a slide show presented showing dopamine discharges in the brain from sex, food, and drugs. In each instance, the dopamine discharged due to the presence of drugs was much greater. The resulting power of the addiction and drive to use was emphasized. The discomfort that many go through in early recovery was explained and although it is difficult, members were assured that they would feel better if they could remain abstinent. The new member shared about her drug use and life and received a warm welcome from her new group members.   Summary: The patient reported she had missed group on Friday, but had spent most of the day with her husband at the eye doctor. He was scheduled for an upcoming surgical procedure and had to complete a number of tests in preparation. She reported she had  attended the 4 pm women's AA meeting on Sunday and had seen 2 other members. It had been a very powerful meeting. She admitted it had been the highlight of her weekend. The patient reported she was worried about how she will deal with temptations around alcohol once she has left this program. She admitted that during the [place] her husband had drank and she looked away from him and didn't focus on his alcohol use during the game. She wasn't sure that would be as easy in the months to come. The patient shared little of herself during the psycho-ed presentation. She continues to make good progress in her recovery, but will have to develop deeper or more connections in the recovery community so she has support when she is not longer in this program. Her sobriety date remains 12/18. A drug test was collected from this patient and the results should be back by the next group session. All drug tests have been negative to date.    Family Program: Family present? No   Name of family member(s):   UDS collected: Yes Results: not returned from lab  AA/NA attended?: YesFriday and Sunday  Sponsor?: Yes   Kirtis Challis, LCAS

## 2013-08-26 ENCOUNTER — Other Ambulatory Visit (HOSPITAL_COMMUNITY): Payer: Medicare Other | Admitting: Psychology

## 2013-08-26 DIAGNOSIS — F10230 Alcohol dependence with withdrawal, uncomplicated: Secondary | ICD-10-CM

## 2013-08-26 DIAGNOSIS — F1020 Alcohol dependence, uncomplicated: Secondary | ICD-10-CM | POA: Diagnosis not present

## 2013-08-28 ENCOUNTER — Encounter (HOSPITAL_COMMUNITY): Payer: Self-pay | Admitting: Psychology

## 2013-08-28 ENCOUNTER — Other Ambulatory Visit (HOSPITAL_COMMUNITY): Payer: Medicare Other | Admitting: Psychology

## 2013-08-28 DIAGNOSIS — F1020 Alcohol dependence, uncomplicated: Secondary | ICD-10-CM | POA: Diagnosis not present

## 2013-08-28 DIAGNOSIS — F329 Major depressive disorder, single episode, unspecified: Secondary | ICD-10-CM

## 2013-08-28 DIAGNOSIS — F32A Depression, unspecified: Secondary | ICD-10-CM

## 2013-08-28 NOTE — Progress Notes (Signed)
    Daily Group Progress Note  Program: CD-IOP   Group Time: 1:00-2:30pm  Participation Level: Active  Behavioral Response: Appropriate  Type of Therapy: Process Group  Topic: Group members checked in by sharing their names and sobriety dates.  Counseling intern led 5 minutes of relaxation time using HeartMath.  Counselor then helped the two group members who relapsed process what led to the relapses and how they could have prevented them.  Two new group members shared their stories.  Other group members shared how they were doing and discussed issues and concerns about early recovery, including having difficulty saying "no" to requests from others.     Group Time: 2:45-4:00pm  Participation Level: Active  Behavioral Response: Appropriate  Type of Therapy: Psycho-education Group  Topic: Counseling intern presented information about the 4 phases of recovery: bottoming out, ambivalence, commitment, and integration.  She also explained the reasons it is important to understand what to expect during each phase. Group members discussed their own experiences with phases of recovery and completed a checklist to help them determine which phase they are in.  They also discussed what they learned about themselves and their recovery from completing the checklist.   Summary: Patient's sobriety date remains 12/18.  She reported that she had attended meetings and exercised at the gym in order to support her recovery.  She shared that she had said "no" to a request from her daughter, and although she experienced feelings of guilt, she also felt good about being able to be assertive.  During the psychoeducation session, she shared about her current experiences with moving through phases of recovery, noting that her recovery was somewhat different from the typical experience, but still had distinct stages.  Patient continues to do well in group and in her recovery.   Family Program: Family present?  No   Name of family member(s): n/a  UDS collected: No Results: n/a  AA/NA attended?: [place] and Tuesday  Sponsor?: Yes   [facility]

## 2013-08-31 ENCOUNTER — Other Ambulatory Visit (HOSPITAL_COMMUNITY): Payer: Medicare Other | Admitting: Psychology

## 2013-08-31 ENCOUNTER — Encounter (HOSPITAL_COMMUNITY): Payer: Self-pay | Admitting: Psychology

## 2013-08-31 DIAGNOSIS — F32A Depression, unspecified: Secondary | ICD-10-CM

## 2013-08-31 DIAGNOSIS — F1020 Alcohol dependence, uncomplicated: Secondary | ICD-10-CM

## 2013-08-31 DIAGNOSIS — F329 Major depressive disorder, single episode, unspecified: Secondary | ICD-10-CM

## 2013-08-31 NOTE — Progress Notes (Signed)
Daily Group Progress Note  Program: CD-IOP   Group Time: 1-2:30 pm  Participation Level: Minimal  Behavioral Response: Appropriate  Type of Therapy: Psycho-education Group  Topic: Guest Speaker: Pharmacist. The first part of group was spent with a guest speaker. Elena Payne, the pharmacist upstairs in the [facility] spent time with the group. She discussed drugs that contribute to great harm and how they affect the body and mind. She also provided education about medications that are typically prescribed to those in early recovery and what they do to help ease distress and instability so often reported. There was excellent feedback and questions from members. During this time the medical director, Charles Kober, PA, met with 2 new patients and current members who were needed to discuss current medications. The group seemed very pleased with the information this visitor had provided to them.    Group Time: 2:45- 4pm  Participation Level: Active  Behavioral Response: Appropriate and Sharing  Type of Therapy: Process Group  Topic: Group Process: The second half of group was spent in process. Members shared about current issues and concerns. One of the newer members shared about what she had done to remain drug-free since the last session. Another member explained what he was going to do differently this time in early recovery. The importance of developing a daily routine that one consistently follows was emphasized during this session. As the session neared an end, members shared about plans for the upcoming weekend.   Summary: The patient reported she had attended the AA meeting this morning and is planning on going to the 4 pm Women's meeting on Sunday. During the visit with the pharmacist, the patient asked a number of questions regarding sleep. She explained that she suffers from sleep apnea and uses a C-Pap nightly. Her sleep is a little more restful, but sleeping with the machine is not  particularly soothing. In process, another group member wondered if it's hard to be around her husband in the evening while he is drinking? She admitted that it is very comforting having this group and the accountability that it requires, but reported, "I am not going to let him affect my sobriety". The group applauded her for this assertive statement and I noted she was 'owning', or assuming responsibility, for her recovery. The patient has made good progress in her recovery and is attending at least 5 meetings per week. She displays good insight into her daily recovery needs and responded well to this intervention. Her sobriety date remains 12/18.    Family Program: Family present? No   Name of family member(s):   UDS collected: No Results: all drug tests have been negative  AA/NA attended?: YesThursday and Friday  Sponsor?: Yes   EVANS,ANN, LCAS        

## 2013-09-01 ENCOUNTER — Encounter (HOSPITAL_COMMUNITY): Payer: Self-pay | Admitting: Psychology

## 2013-09-01 NOTE — Progress Notes (Signed)
    Daily Group Progress Note  Program: CD-IOP   Group Time: 1-2:30 pm  Participation Level: Active  Behavioral Response: Appropriate and Sharing  Type of Therapy: Process Group  Topic: Group Process: the first part of group was spent in process. Members shared about the past weekend and any issues or concerns they were having. One member seemed very emotional and was crying. She finally shared that she had been on an emotional roller coaster and had cried most of the weekend. Group members provided good feedback and a number of them verified that they had also struggled with emotional imbalance and lots of crying. There was good discussion and feedback among the group. Also present today were two students from the [employer]. They are here for a rotation.   Group Time: 2:45- 4pm  Participation Level: Minimal  Behavioral Response: Sharing  Type of Therapy: Psycho-education Group  Topic: Deactivation or Extinguishing of Cravings/Graduation: A psycho Ed was presented in the second half of group. Handouts were provided, including a brief case study of a man who had relapsed. Members read the case out loud and then they made observations about what had occurred. The group was able to identify a number of problems this man had made from the moment he got out of bed. It was a good study and it invited members to share how they had done similar things in the past. At the end of the session, a graduation ceremony was held for a member graduating from the program. Kind words of hope and encouragement were shared along with brownies and tears. The session proved very powerful and members left clearly touched by this session.   Summary: The patient reported she had attended 1 AA meeting and spoken with her sponsor over the weekend. She had also gone to church. She reported her neighbor at 64 yo had been taken by the ambulance on Friday afternoon as she arrived home and that was upsetting for her.   She is also continuing to use her journalling as another type of therapy and release. She admitted that she tries to distract herself or not look at her husband while he is drinking prior to dinner every night. She reported it is harder at times then others. During the graduation, the patient reported she would really miss the departing member and noted how she had witnessed her evolve and change during her time here in the program. She made some good comments and continues to make good progress in her recovery. The patient's sobriety date remains 12/18.    Family Program: Family present? No   Name of family member(s):   UDS collected: No Results:  AA/NA attended?: [place] and Friday  Sponsor?: Yes   Zenola Dezarn, LCAS

## 2013-09-02 ENCOUNTER — Other Ambulatory Visit (HOSPITAL_COMMUNITY): Payer: Medicare Other | Admitting: Psychology

## 2013-09-02 DIAGNOSIS — F329 Major depressive disorder, single episode, unspecified: Secondary | ICD-10-CM

## 2013-09-02 DIAGNOSIS — F1020 Alcohol dependence, uncomplicated: Secondary | ICD-10-CM | POA: Diagnosis not present

## 2013-09-02 DIAGNOSIS — F32A Depression, unspecified: Secondary | ICD-10-CM

## 2013-09-04 ENCOUNTER — Other Ambulatory Visit (HOSPITAL_COMMUNITY): Payer: Medicare Other | Admitting: Psychology

## 2013-09-04 DIAGNOSIS — F1020 Alcohol dependence, uncomplicated: Secondary | ICD-10-CM

## 2013-09-04 DIAGNOSIS — F329 Major depressive disorder, single episode, unspecified: Secondary | ICD-10-CM

## 2013-09-04 DIAGNOSIS — F32A Depression, unspecified: Secondary | ICD-10-CM

## 2013-09-07 ENCOUNTER — Other Ambulatory Visit (HOSPITAL_COMMUNITY): Payer: Medicare Other

## 2013-09-07 ENCOUNTER — Encounter (HOSPITAL_COMMUNITY): Payer: Self-pay | Admitting: Psychology

## 2013-09-07 NOTE — Progress Notes (Signed)
    Daily Group Progress Note  Program: CD-IOP   Group Time: 1-2:30 pm  Participation Level: Active  Behavioral Response: Sharing  Type of Therapy: Process Group  Topic:  Group members checked in by sharing their names and sobriety dates.  Counselors led 5 minutes of HeartMath to promote focus and relaxation.  Several group members shared about difficulties they were experiencing in early recovery, including sleep issues, relationship problems, and selecting sponsors.  Chaplain Jeanella Craze then led a lesson on developing spiritual practices, involving the group members in discussions of their current practices and leading them through examples.    Group Time: 2:45- 4pm  Participation Level: Minimal  Behavioral Response: Sharing  Type of Therapy: Psycho-education Group  Topic: Counselors led a discussion on the 10 most common dangers in early recovery and had group members complete a worksheet on situations that are triggering for them.  Counselors reviewed each of the common dangers and had the group members discuss the reasons that each one can trigger cravings.  Group members also shared about other situations that are dangerous for them in early recovery, as well as situations that are becoming less dangerous over time as they develop better coping skills.  Summary: Patient's sobriety date remains 12/18. She reported that she had journaled, attended meetings, met with her sponsor, and exercised to support her recovery. During the chaplain's presentation she shared that she attends church regularly and prays every day as her spiritual practices.  She also reported that she is beginning to reach out to more people.  During the psychoeducation session, she reported that being around her husband while he drinks is a trigger she has learned to manage.  Patient continues to do well in her recovery and group sessions   Family Program: Family present? No   Name of family member(s):   UDS  collected: No Results:   AA/NA attended?: YesTuesday and Wednesday  Sponsor?: Yes   Joseeduardo Brix, LCAS

## 2013-09-09 ENCOUNTER — Other Ambulatory Visit (HOSPITAL_COMMUNITY): Payer: Medicare Other | Admitting: Psychology

## 2013-09-09 DIAGNOSIS — F1020 Alcohol dependence, uncomplicated: Secondary | ICD-10-CM

## 2013-09-10 ENCOUNTER — Encounter (HOSPITAL_COMMUNITY): Payer: Self-pay | Admitting: Psychology

## 2013-09-10 NOTE — Progress Notes (Unsigned)
    Daily Group Progress Note  Program: CD-IOP   Group Time: 1:00-2:30pm  Participation Level: Active  Behavioral Response: Appropriate  Type of Therapy: Process Group  Topic: Group members shared their names and sobriety dates, and counselor led them through a 5-minute mindfulness breathing exercise.  Group members breathed deeply while focusing on the words "just this one moment."  Counselor then invited group members to share about how they were doing in their recovery.  They discussed meetings they had attended and shared about other recovery activities.  They also discussed how the recent inclement weather had affected them.  A new group member shared his story, and other group members welcomed him to the group.     Group Time: 2:45-4:00pm  Participation Level: Active  Behavioral Response: Appropriate  Type of Therapy: Psycho-education Group  Topic: Counseling intern provided a handout on early warning signs of relapse and led a discussion on the cognitive, behavioral, and emotional warning signs.  Group members shared about their personal warning signs.  Counseling intern then demonstrated the green light/yellow light/red light activity, with the green light representing positive recovery activities, yellow light representing early warning signs of relapse, and red light representing relapse.  Group members brainstormed ideas about each area.  Several group members reported that the visual representation of their recovery activities and warning signs was helpful.  Counseling intern encouraged them to use the image to monitor their behaviors.   Summary: Patient's sobriety date remains 12/18.  She shared that on Sunday she had attended church and a speaker meeting, and had returned to her usual schedule of meetings after the weather improved.  She shared that although she stayed home all day with her husband Tuesday, it was not difficult to be around him for that amount of time.   During the psychoeducation group, she shared that although she has not experienced relapse, she believes stopping attending meetings would be a warning sign for her.  Patient continues to do well in her recovery and in group sessions.   Family Program: Family present? No   Name of family member(s): n/a  UDS collected: No Results: n/a  AA/NA attended?: [place] and Wednesday  Sponsor?: Yes   [facility]

## 2013-09-10 NOTE — Progress Notes (Signed)
    Daily Group Progress Note  Program: CD-IOP   Group Time: 1-2:30 pm  Participation Level: Active  Behavioral Response: Appropriate and Sharing  Type of Therapy: Process Group  Topic: Group Process. The first part of group was spent in process. Members shared about the past few days and any obstacles or challenges to their sobriety. A new member was present and he was asked to introduce himself. During this time, the medical director was meeting with new patients and current group members who wanted to discuss any concerns with their medications.   Group Time: 2:45- 4pm  Participation Level: Active  Behavioral Response: Sharing  Type of Therapy: Activity Group  Topic: Group Activity: "Getting to Know You". The second part of group was spent in an activity that would contribute to members knowing more about each other and do it in a funny and pleasurable way. Members were asked to write 3 things about themselves, with only one of the 3 statements actually true. They're notes were placed in a basket and members drew them out of the basket, read them, and tried to guess who the Pryor Curia was. The session proved to be a light-hearted, but very disclosing session.    Summary: the patient reported she was doing well. She had attended the 10 am AA meeting today and yesterday and the meetings continue to be very inspiring and motivating. She reported she had attended her grandson's band concert performance. He plays the Oboe. Group members were not familiar with this instrument, but one member could explain it well. The patient admitted she was a little anxious about her husband's behavior because he had had a few drinks before they left for the concert. She admitted he was a bit noisy and it was a little embarrassing for both herself and her daughter. One group member asked about the patient's husband and his response to her sobriety and engagement in this program? The patient admitted he hasn't  said much and it certainly hasn't compelled him to reduce his drinking, but she is hoping he will begin to notice the changes she has made and maybe considering doing some changing himself. The patient laughed as members tried to guess her in the activity. She enjoyed herself and make some good comments herself. She continues to make good progress in her recovery and her sobriety date remains 12/18.    Family Program: Family present? No   Name of family member(s):   UDS collected: No Results:  AA/NA attended?: YesThursday and Friday  Sponsor?: Yes   Kayron Hicklin, LCAS

## 2013-09-11 ENCOUNTER — Encounter (HOSPITAL_COMMUNITY): Payer: Self-pay | Admitting: Psychology

## 2013-09-11 ENCOUNTER — Other Ambulatory Visit (HOSPITAL_COMMUNITY): Payer: Medicare Other | Admitting: Psychology

## 2013-09-11 DIAGNOSIS — F329 Major depressive disorder, single episode, unspecified: Secondary | ICD-10-CM

## 2013-09-11 DIAGNOSIS — F32A Depression, unspecified: Secondary | ICD-10-CM

## 2013-09-11 DIAGNOSIS — F1020 Alcohol dependence, uncomplicated: Secondary | ICD-10-CM | POA: Diagnosis not present
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
## 2013-09-11 NOTE — Progress Notes (Signed)
  [facility] Discharge Summary   [name] [id number]  Date of Admission: 12/19/2014Date of Discharge: 09/14/2013  Course of Treatment: Pt entered [facility] CD IOP program for relapse into alcoholism complicated by her childhood in with an alcoholic mother and her marriage to an active alcoholic husband after long term sobriety.She has done well reengaging with AA and getting sponsorship.She has returned to the faith of her choice as well.She has been advised to go to [place] with her daughter to start experiencing 12 Steps from point of view of a relative/spouse of an alcoholic ("Codependent")  Goals and Activities to Help Maintain Sobriety: 1. Stay away from old friends who continue to drink and use mind-altering chemicals. 2. Continue practicing Fair Fighting rules in interpersonal conflicts. 3. Continue alcohol and drug refusal skills and call on support systems. 4. Continue with 12 Step program/sponsor especially women with similar history and begin to explore [place]/codependency 12 Step treatment  Referrals: Pt is referred back to her PCP Roe Coombs [facility] for medication management. The patient should avoid all controlled substances as they pose a threat to the reactivation of craving.Should she require pain medication pt should have someone trusted hold and dispense and discard any unused drug.   Aftercare services:  1. Attend AA meetings 7 times per week.for 90 days 2. Continue with sponsor and a home group in [place] 3. Begin codependency therapy in Al Anon-seek professional help if needed  Next appointment: per PA [facility] of [facility] to Address Continuing Problems: Meet with women who have similar life circumstances outside of meetings and especially during periods where she would end up sitting at home alone for hours.Attend [place] with daughter once a week  Pt asked about possible medications for cravings  Naltrexone,acamprosite and antabuse were discussed.She has no interest in Antabuse or the long acting forms of naltrexone.She is ambivalent about taking any more meds than she is already on and really wants to try and maintain abstinence without medication.She will discuss this further with her PCP.She can also contact Brandon Melnick about medication issues down the road    Client has participated in the development of this discharge plan and has received a copy of this completed plan  Dara Hoyer  09/11/2013   [facility] 09/11/2013

## 2013-09-13 ENCOUNTER — Encounter (HOSPITAL_COMMUNITY): Payer: Self-pay | Admitting: Psychology

## 2013-09-13 NOTE — Progress Notes (Signed)
Daily Group Progress Note  Program: CD-IOP   Group Time: 1-2:30 pm  Participation Level: Minimal  Behavioral Response: Sharing  Type of Therapy: Process Group  Topic: Group process: the first part of group was spent in process. Members checked in on current issues, concerns, and obstacles in early recovery. One member appeared today, who had not been present on Wednesday. This young woman had checked in with a new sobriety date.   . A new group member provided some feedback and, as the session progressed, and he talked more, he seemed to know everything about recovery. His dominating conversation was clearly irritating other members and when I finally asked a member what she was feeling, she admitted she was 'aggravated'. She explained that someone was doing most of the talking and she found it frustrating. Another member noted that if he knew so much about recovery, what was he doing in this group? The challenges brought the entire group to life and there was a lengthy discussion about the role of group and what is and is not expected from group members. The session proved therapeutic and revealing for all present. During this session, the medical director was meeting with two members scheduled to discharge next week along with two new group members.   Group Time: 2:45- 4pm  Participation Level: Active  Behavioral Response: Appropriate and Sharing  Type of Therapy: Psycho-education Group  Topic: The second half of group was spent in a psycho-ed about early recovery and PEPS; an acronym for participation, enthusiasm, practice, and support. A more thorough presentation on these 4 aspects of a recovery lifestyle were discussed at length. Following was a handout provided members listing different activities people might consider in early recovery. During this half of group, cupcakes were passed around and a candle on one presented to a member whose birthday was today. She made a wish, blew  out the candle, and the group sang 'Happy Birthday'. Members shared the things they did and discussed the activities they would like to consider in the future. The group laughed as they discussed hobbies they hoped to return to once more or begin anew. As the session ended, members shared with each other their recovery plans for the weekend.    Summary: the patient reported she had attended [place] meetings everyday since the last group and was doing well. I pointed out that she would be graduating on Monday and had all of the tools she needed to remain alcohol-free. The patient agreed with these comments and reported that this program had been a "Godsend" and she was very happy to have met so many wonderful people in recovery. She fully intended to remain engaged and active in the Fellowship. I pointed out that something special had occurred and she informed the group that she had picked up her 60 day chip. They applauded this good news and accomplishment.  In the second half of group, the patient reported she was going to remain active in weekly AA meetings and agreed that she would begin attending Al-Anon meetings with her daughter. The patient reporteshe realized she would have to stay busy and not spend all the afternoons at home alone. She identified gardening as an activity that she really enjoyed and was looking forward to planting in the spring. The patient has made excellent progress and done very well since she entered the program. Her sobriety date remains 12/18.    Family Program: Family present? No   Name of family member(s):  UDS collected: No Results:   AA/NA attended?: YesThursday and Friday  Sponsor?: Yes   Karisha Marlin, LCAS

## 2013-09-14 ENCOUNTER — Other Ambulatory Visit (HOSPITAL_COMMUNITY): Payer: Medicare Other | Admitting: Psychology

## 2013-09-14 DIAGNOSIS — F32A Depression, unspecified: Secondary | ICD-10-CM

## 2013-09-14 DIAGNOSIS — F1020 Alcohol dependence, uncomplicated: Secondary | ICD-10-CM

## 2013-09-14 DIAGNOSIS — F329 Major depressive disorder, single episode, unspecified: Secondary | ICD-10-CM

## 2013-09-15 ENCOUNTER — Encounter (HOSPITAL_COMMUNITY): Payer: Self-pay | Admitting: Psychology

## 2013-09-15 NOTE — Progress Notes (Signed)
    Daily Group Progress Note  Program: CD-IOP   Group Time: 1-2:30 pm  Participation Level: Minimal  Behavioral Response: Appropriate and Sharing  Type of Therapy: Process Group  Topic: Group Process: the first part of group was spent in process. Members shared about current issues and challenges in early recovery. One member reported she had been doing some 'stinkin thinking'. Another member reported he had been enabling his cousin and his girlfriend was using and he had finally realized he needed to save himself. Another member reported he had been around a bunch of people drinking, but he enjoyed his ginger ale and didn't get tempted. No one had relapsed over the weekend. Random drug tests were collected.   Group Time: 2:45-4 pm  Participation Level: Active  Behavioral Response: Appropriate and Sharing  Type of Therapy: Psycho-education Group  Topic:Group Process: Core Beliefs. Second part of group was spent in a psycho-ed session focusing on Core Beliefs. A handout was provided explaining how Core Beliefs first develop. The handout was read by group members and examples provided. When asked to share some of their own Core Beliefs, one member shared that her CB was "I'm not good enough". The group assisted her in identifying evidence contrary to this belief, including one member telling her, "You are more than good enough", and another explaining that being around her "makes me feel good enough". I emphasized the foundational presence of negative Core Beliefs as the fuel that feeds one's addiction. Group members were very empowered and energized in recognizing the power of these Core Beliefs and how they might begin to challenge and change theirs.    Summary: The patient reported she had had a good weekend. She went to church, attended the 4 pm AA meeting yesterday, and was very inspired by the speaker. In the second part of group, the patient reported she was very grateful to have  come to this program and expressed her appreciation to her fellow group members for their support and their friendship. As the graduation medallion coin was passed around, the patient appeared to be fighting back tears. She had become very well-liked among the group and her graduation today was received with mixed feelings. The patient reported she intended to remain engaged in the Fellowship and continue her step work with her sponsor. The patient admitted she had concerns because her husband has recently been commenting on how she should go to meetings closer to the house. This patient has done well and made good progress in her recovery. Whether she continues to stand yo to her husband, who is a heavy drinker, or gives up and goes back to the bottle remains to be seen. She admitted she has all of the tools she needs to remain sober and intends to use them. The patient leaves [place] having successfully completed the CD-IOP. Her sobriety date remains 12/18.    Family Program: Family present? No   Name of family member(s):   UDS collected: No Results:  Every drug test collected from this patient has been negative  AA/NA attended?: YesMonday, Friday and Sunday  Sponsor?: Yes   Lucielle Vokes, LCAS

## 2013-09-16 ENCOUNTER — Other Ambulatory Visit (HOSPITAL_COMMUNITY): Payer: Medicare Other
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
## 2013-10-05 ENCOUNTER — Encounter (HOSPITAL_COMMUNITY): Payer: Self-pay | Admitting: Psychology

## 2013-10-19 ENCOUNTER — Encounter: Payer: Self-pay | Admitting: Psychiatry

## 2015-04-18 ENCOUNTER — Encounter: Payer: Self-pay | Admitting: Internal Medicine

## 2016-04-12 ENCOUNTER — Other Ambulatory Visit: Payer: Self-pay | Admitting: Physician Assistant

## 2016-04-12 ENCOUNTER — Ambulatory Visit
Admission: RE | Admit: 2016-04-12 | Discharge: 2016-04-12 | Disposition: A | Discharge status: Home / Self Care | Payer: Medicare Other | Source: Ambulatory Visit | Attending: Physician Assistant | Admitting: Physician Assistant

## 2016-04-12 DIAGNOSIS — M542 Cervicalgia: Secondary | ICD-10-CM

## 2017-07-25 ENCOUNTER — Telehealth: Payer: Self-pay | Admitting: Internal Medicine

## 2017-07-25 NOTE — Telephone Encounter (Signed)
Left message for patient notifying her of this.

## 2017-07-25 NOTE — Telephone Encounter (Signed)
ok 

## 2018-02-03 ENCOUNTER — Encounter (HOSPITAL_COMMUNITY): Payer: Self-pay | Admitting: Emergency Medicine

## 2018-02-03 ENCOUNTER — Observation Stay (HOSPITAL_COMMUNITY)
Admission: EM | Admit: 2018-02-03 | Discharge: 2018-02-05 | Disposition: A | Discharge status: Home / Self Care | Payer: Medicare Other | Attending: Internal Medicine | Admitting: Internal Medicine

## 2018-02-03 ENCOUNTER — Other Ambulatory Visit: Payer: Self-pay

## 2018-02-03 ENCOUNTER — Emergency Department (HOSPITAL_COMMUNITY): Payer: Medicare Other

## 2018-02-03 DIAGNOSIS — I208 Other forms of angina pectoris: Secondary | ICD-10-CM | POA: Diagnosis not present

## 2018-02-03 DIAGNOSIS — E785 Hyperlipidemia, unspecified: Secondary | ICD-10-CM | POA: Diagnosis not present

## 2018-02-03 DIAGNOSIS — I10 Essential (primary) hypertension: Secondary | ICD-10-CM | POA: Diagnosis not present

## 2018-02-03 DIAGNOSIS — Z8249 Family history of ischemic heart disease and other diseases of the circulatory system: Secondary | ICD-10-CM | POA: Diagnosis not present

## 2018-02-03 DIAGNOSIS — R079 Chest pain, unspecified: Secondary | ICD-10-CM | POA: Diagnosis present

## 2018-02-03 DIAGNOSIS — Z880 Allergy status to penicillin: Secondary | ICD-10-CM | POA: Diagnosis not present

## 2018-02-03 DIAGNOSIS — Z79899 Other long term (current) drug therapy: Secondary | ICD-10-CM | POA: Insufficient documentation

## 2018-02-03 DIAGNOSIS — R0789 Other chest pain: Principal | ICD-10-CM | POA: Insufficient documentation

## 2018-02-03 DIAGNOSIS — Z7982 Long term (current) use of aspirin: Secondary | ICD-10-CM | POA: Insufficient documentation

## 2018-02-03 DIAGNOSIS — R748 Abnormal levels of other serum enzymes: Secondary | ICD-10-CM

## 2018-02-03 LAB — BASIC METABOLIC PANEL
ANION GAP: 6 (ref 5–15)
BUN: 16 mg/dL (ref 8–23)
CO2: 28 mmol/L (ref 22–32)
Calcium: 9 mg/dL (ref 8.9–10.3)
Chloride: 108 mmol/L (ref 98–111)
Creatinine, Ser: 1.14 mg/dL — ABNORMAL HIGH (ref 0.44–1.00)
GFR calc Af Amer: 55 mL/min — ABNORMAL LOW (ref >60)
GFR calc non Af Amer: 48 mL/min — ABNORMAL LOW (ref >60)
Glucose, Bld: 85 mg/dL (ref 70–99)
POTASSIUM: 4.7 mmol/L (ref 3.5–5.1)
Sodium: 142 mmol/L (ref 135–145)

## 2018-02-03 LAB — I-STAT TROPONIN, ED
Troponin i, poc: 0 ng/mL (ref 0.00–0.08)
Troponin i, poc: 0 ng/mL (ref 0.00–0.08)

## 2018-02-03 LAB — CBC
HEMATOCRIT: 39 % (ref 36.0–46.0)
HEMOGLOBIN: 12.6 g/dL (ref 12.0–15.0)
MCH: 29.7 pg (ref 26.0–34.0)
MCHC: 32.3 g/dL (ref 30.0–36.0)
MCV: 92 fL (ref 78.0–100.0)
Platelets: 251 10*3/uL (ref 150–400)
RBC: 4.24 MIL/uL (ref 3.87–5.11)
RDW: 13.4 % (ref 11.5–15.5)
WBC: 6.5 10*3/uL (ref 4.0–10.5)

## 2018-02-03 MED ORDER — ONDANSETRON HCL 4 MG/2ML IJ SOLN: 4.0000 mg | Freq: Four times a day (QID) | INTRAMUSCULAR | Status: DC | PRN

## 2018-02-03 MED ORDER — HYDROCODONE-ACETAMINOPHEN 5-325 MG PO TABS: 1.0000 | ORAL_TABLET | ORAL | Status: DC | PRN

## 2018-02-03 MED ORDER — MORPHINE SULFATE (PF) 4 MG/ML IV SOLN: 2.0000 mg | INTRAVENOUS | Status: DC | PRN

## 2018-02-03 MED ORDER — ZOLPIDEM TARTRATE 5 MG PO TABS: 5.0000 mg | ORAL_TABLET | Freq: Every evening | ORAL | Status: DC | PRN

## 2018-02-03 MED ORDER — ROSUVASTATIN CALCIUM 20 MG PO TABS
20.0000 mg | ORAL_TABLET | Freq: Every day | ORAL | Status: DC
  Administered 2018-02-04 (×2): 20 mg via ORAL
  Filled 2018-02-03: qty 1
  Filled 2018-02-03: qty 2
  Filled 2018-02-03: qty 1

## 2018-02-03 MED ORDER — ACETAMINOPHEN 650 MG RE SUPP: 650.0000 mg | Freq: Four times a day (QID) | RECTAL | Status: DC | PRN

## 2018-02-03 MED ORDER — ALBUTEROL SULFATE (2.5 MG/3ML) 0.083% IN NEBU: 2.5000 mg | INHALATION_SOLUTION | RESPIRATORY_TRACT | Status: DC | PRN

## 2018-02-03 MED ORDER — ONDANSETRON HCL 4 MG PO TABS: 4.0000 mg | ORAL_TABLET | Freq: Four times a day (QID) | ORAL | Status: DC | PRN

## 2018-02-03 MED ORDER — ENOXAPARIN SODIUM 40 MG/0.4ML ~~LOC~~ SOLN: 40.0000 mg | SUBCUTANEOUS | Status: DC

## 2018-02-03 MED ORDER — ASPIRIN 81 MG PO CHEW
81.0000 mg | CHEWABLE_TABLET | Freq: Every day | ORAL | Status: DC
  Administered 2018-02-04 – 2018-02-05 (×2): 81 mg via ORAL
  Filled 2018-02-03 (×2): qty 1

## 2018-02-03 MED ORDER — ACETAMINOPHEN 325 MG PO TABS
650.0000 mg | ORAL_TABLET | Freq: Four times a day (QID) | ORAL | Status: DC | PRN
  Administered 2018-02-03: 650 mg via ORAL
  Filled 2018-02-03: qty 2

## 2018-02-03 MED ORDER — GLUCOSAMINE HCL 1000 MG PO TABS: 2000.0000 mg | ORAL_TABLET | Freq: Every day | ORAL | Status: DC

## 2018-02-03 NOTE — ED Triage Notes (Signed)
Pt BIB [facility] from PCP, c/o chest pain with nausea/dizziness that started 0800 this am. Pain worse on inspiration. Given 324 mg asa, 1 NTG and 4mg  zofran. Nausea resolved, pain went from 6 to 3 after nitro. VSS

## 2018-02-03 NOTE — ED Notes (Signed)
Husbands number if any changes occur. [place]

## 2018-02-03 NOTE — H&P (Addendum)
[facility]                                                                                    Patient Demographics  [name], is a 72 y.o. female  CSN: [id number]  MRN: [id number]  DOB - [birth year]  Admit Date - 02/03/2018  Outpatient Primary MD for the patient is [facility], [facility]   With History of -  Past Medical History:  Diagnosis Date  . Hyperlipidemia   . Hypertension       History reviewed. No pertinent surgical history.  in for   Chief Complaint  Patient presents with  . Chest Pain     HPI  [name]  is a 72 y.o. female, with past medical history significant for hypertension and hyperlipidemia presenting today with right-sided chest pain episodes, on and off that woke her up from sleep today in a.m. and has a pleuritic component.  Associated with shortness of breath and nausea but no cold sweats.  Pain improved after nitro sublingual was given.  Chest pain is crushing and radiating to the back and the right upper part of the abdomen.  It started at 10/10 and decreased to 3/10 upon nitroglycerin sublingual Patient had a stress test around 5 years ago at [facility].  Has a family history of heart disease in her father.  Denies any history of smoking or alcoholism.  No history of leg swelling, orthopnea or PND's    Review of Systems    In addition to the HPI above,  No Fever-chills, No Headache, No changes with Vision or hearing, No problems swallowing food or Liquids, No Abdominal pain, No Nausea or Vommitting, Bowel movements are regular, No Blood in stool or Urine, No dysuria, No new skin rashes or bruises, No new joints pains-aches,  No new weakness, tingling, numbness in any extremity, No recent weight gain or loss, No polyuria, polydypsia or polyphagia, No significant Mental Stressors.  A full 10 point Review of Systems was done, except as stated above, all other Review of Systems were  negative.   Social History Social History   Tobacco Use  . Smoking status: Never Smoker  . Smokeless tobacco: Never Used  Substance Use Topics  . Alcohol use: Yes    Alcohol/week: 18.0 oz    Types: 30 Glasses of wine per week     Family History Family History  Problem Relation Age of Onset  . Liver disease Mother   . Alcohol abuse Mother   . Heart attack Father      Prior to Admission medications   Medication Sig Start Date End Date Taking? Authorizing Provider  aspirin 81 MG tablet Take 81 mg by mouth daily.   Yes [provider]  Cholecalciferol (VITAMIN D3) 1000 UNITS CAPS Take 2,000 Units by mouth daily.    Yes [provider]  fish oil-omega-3 fatty acids 1000 MG capsule Take 1 g by mouth 3 (three) times daily.    Yes [provider]  fluticasone (FLONASE) 50 MCG/ACT nasal spray Place 2 sprays into both nostrils daily.   Yes [provider]  Glucosamine HCl 1000 MG TABS  Take 2,000 mg by mouth daily.   Yes [provider]  Multiple Vitamin (MULTIVITAMIN) tablet Take 1 tablet by mouth daily.   Yes [provider]  Probiotic Product (PROBIOTIC PO) Take 1 capsule by mouth daily.   Yes [provider]  rosuvastatin (CRESTOR) 20 MG tablet Take 20 mg by mouth at bedtime.    Yes [provider]    Allergies  Allergen Reactions  . Penicillins Hives    Has patient had a PCN reaction causing immediate rash, facial/tongue/throat swelling, SOB or lightheadedness with hypotension: Yes Has patient had a PCN reaction causing severe rash involving mucus membranes or skin necrosis: No Has patient had a PCN reaction that required hospitalization: Yes Has patient had a PCN reaction occurring within the last 10 years: No If all of the above answers are "NO", then may proceed with Cephalosporin use.     Physical Exam  Vitals  Blood pressure 124/79, pulse 62, resp. rate 14, SpO2 97 %.   1. General no acute  distress,  2. Normal affect and insight, Not Suicidal or Homicidal, Awake Alert, Oriented X 3.  3. No F.N deficits, ALL C.Nerves Intact, Strength 5/5 all 4 extremities, Sensation intact all 4 extremities, Plantars down going.  4. Ears and Eyes appear Normal, Conjunctivae clear, PERRLA. Moist Oral Mucosa.  5. Supple Neck, No JVD, No cervical lymphadenopathy appriciated, No Carotid Bruits.  6. Symmetrical Chest wall movement, Good air movement bilaterally, CTAB.  7. RRR, No Gallops, Rubs or Murmurs, No Parasternal Heave.  8. Positive Bowel Sounds, Abdomen Soft, Non tender, No organomegaly appriciated,No rebound -guarding or rigidity.  9.  No Cyanosis, Normal Skin Turgor, No Skin Rash or Bruise.  10. Good muscle tone,  joints appear normal , no effusions, Normal ROM.    Data Review  CBC Recent Labs  Lab 02/03/18 1821  WBC 6.5  HGB 12.6  HCT 39.0  PLT 251  MCV 92.0  MCH 29.7  MCHC 32.3  RDW 13.4   ------------------------------------------------------------------------------------------------------------------  Chemistries  Recent Labs  Lab 02/03/18 1821  NA 142  K 4.7  CL 108  CO2 28  GLUCOSE 85  BUN 16  CREATININE 1.14*  CALCIUM 9.0   ------------------------------------------------------------------------------------------------------------------ CrCl cannot be calculated (Unknown ideal weight.). ------------------------------------------------------------------------------------------------------------------ No results for input(s): TSH, T4TOTAL, T3FREE, THYROIDAB in the last 72 hours.  Invalid input(s): FREET3   Coagulation profile No results for input(s): INR, PROTIME in the last 168 hours. ------------------------------------------------------------------------------------------------------------------- No results for input(s): DDIMER in the last 72  hours. -------------------------------------------------------------------------------------------------------------------  Cardiac Enzymes No results for input(s): CKMB, TROPONINI, MYOGLOBIN in the last 168 hours.  Invalid input(s): CK ------------------------------------------------------------------------------------------------------------------ Invalid input(s): POCBNP   ---------------------------------------------------------------------------------------------------------------  Urinalysis No results found for: COLORURINE, APPEARANCEUR, LABSPEC, PHURINE, GLUCOSEU, HGBUR, BILIRUBINUR, KETONESUR, PROTEINUR, UROBILINOGEN, NITRITE, LEUKOCYTESUR  ----------------------------------------------------------------------------------------------------------------  Imaging results:   Dg Chest 2 View  Result Date: 02/03/2018 CLINICAL DATA:  Short of breath 1 day EXAM: CHEST - 2 VIEW COMPARISON:  None. FINDINGS: Normal mediastinum and cardiac silhouette. Normal pulmonary vasculature. No evidence of effusion, infiltrate, or pneumothorax. No acute bony abnormality. IMPRESSION: No acute cardiopulmonary process. Electronically Signed   By: Suzy Bouchard M.D.   On: 02/03/2018 18:18    My personal review of EKG: Rhythm NSR, 72 bpm/nonspecific T wave changes    Assessment & Plan  1.  Chest pain     Improved with nitroglycerin     No significant EKG changes noted at this time     Consider cardiology consult  in a.m. 2.  Hypertension; controlled      No medications 3.  Hyperlipidemia      Continue with Crestor      Check lipid profile in a.m.   DVT Prophylaxis Lovenox  AM Labs Ordered, also please review Full Orders    Code Status full  Disposition Plan: Home  Time spent in minutes : 34 minutes  Condition GUARDED   @SIGNATURE @

## 2018-02-03 NOTE — ED Provider Notes (Signed)
[facility] EMERGENCY DEPARTMENT Provider Note   CSN: [id number] Arrival date & time: 02/03/18  1743     History   Chief Complaint Chief Complaint  Patient presents with  . Chest Pain    HPI [name] is a 72 y.o. female presenting for sudden onset chest tightness that began approximately 8 AM this morning.  Patient states that she was sitting up from a lying position or when her pain began.  She states that her pain was 10/10 in severity, she states pain is located on the right side of her chest and feels like it is behind her right breast and worse with inspiration.  Patient presented to her PCP who was concerned and called EMS to transfer patient to the emergency department. On arrival EMS administered 324 mg of aspirin, 1 nitro and 4 mg of Zofran.  Patient states that her nausea resolved following the Zofran and that her pain decreased to a 3 after the 1 g of nitro. At time of examination patient still endorsing right-sided chest tightness worsened with deep inspiration.  Patient rates her pain as 3/10 in severity at this time. Of note patient has history of hyperlipidemia and hypertension which she takes medication for. Patient denies history of blood clots, cancer, cough, hemoptysis, fever, vomiting or abdominal pain.  Patient O2 saturation 100% in room on room air. HPI  Past Medical History:  Diagnosis Date  . Hyperlipidemia   . Hypertension     Patient Active Problem List   Diagnosis Date Noted  . Alcohol dependence with uncomplicated withdrawal ([place]) 07/12/2013  . Depression 06/30/2013  . Other and unspecified alcohol dependence, unspecified drinking behavior 06/30/2013    History reviewed. No pertinent surgical history.   OB History   None      Home Medications    Prior to Admission medications   Medication Sig Start Date End Date Taking? Authorizing Provider  aspirin 81 MG tablet Take 81 mg by mouth daily.    [provider]  atenolol (TENORMIN) 25 MG tablet Take 25 mg by mouth daily.    [provider]  buPROPion (WELLBUTRIN XL) 150 MG 24 hr tablet Take 150 mg by mouth daily.    [provider]  calcium citrate (CALCITRATE - DOSED IN MG ELEMENTAL CALCIUM) 950 MG tablet Take 1 tablet by mouth daily.    [provider]  Cholecalciferol (VITAMIN D3) 1000 UNITS CAPS Take 1,000 Units by mouth daily.    [provider]  escitalopram (LEXAPRO) 10 MG tablet Take 10 mg by mouth daily.    [provider]  fish oil-omega-3 fatty acids 1000 MG capsule Take 4 g by mouth daily.    [provider]  Multiple Vitamin (MULTIVITAMIN) tablet Take 1 tablet by mouth daily.    [provider]  rosuvastatin (CRESTOR) 20 MG tablet Take 20 mg by mouth daily.    [provider]    Family History Family History  Problem Relation Age of Onset  . Liver disease Mother   . Alcohol abuse Mother   . Heart attack Father     Social History Social History   Tobacco Use  . Smoking status: Never Smoker  . Smokeless tobacco: Never Used  Substance Use Topics  . Alcohol use: Yes    Alcohol/week: 18.0 oz    Types: 30 Glasses of wine per week  . Drug use: Never     Allergies   Penicillins   Review of  Systems Review of Systems  Constitutional: Negative.  Negative for chills, fatigue and fever.  HENT: Negative.  Negative for congestion, ear pain, rhinorrhea, sore throat and trouble swallowing.   Eyes: Negative.  Negative for visual disturbance.  Respiratory: Positive for chest tightness and shortness of breath. Negative for cough.   Cardiovascular: Positive for chest pain. Negative for leg swelling.  Gastrointestinal: Positive for nausea. Negative for abdominal pain, blood in stool, diarrhea and vomiting.  Genitourinary: Negative.  Negative for dysuria, flank pain and hematuria.  Musculoskeletal: Negative.  Negative for arthralgias, myalgias and neck pain.    Skin: Negative.  Negative for rash.  Neurological: Negative.  Negative for dizziness, syncope, weakness, light-headedness and headaches.     Physical Exam Updated Vital Signs BP 119/68   Pulse 64   Resp 13   SpO2 98%   Physical Exam  Constitutional: She is oriented to person, place, and time. She appears well-developed and well-nourished. She does not appear ill. No distress.  HENT:  Head: Normocephalic and atraumatic.  Eyes: Pupils are equal, round, and reactive to light. EOM are normal.  Neck: Normal range of motion. Neck supple. No JVD present. No tracheal deviation present.  Cardiovascular: Normal rate and regular rhythm.    No systolic murmur is present.  No diastolic murmur is present. Pulses:      Dorsalis pedis pulses are 3+ on the right side, and 3+ on the left side.       Posterior tibial pulses are 3+ on the right side, and 3+ on the left side.  Pulmonary/Chest: Effort normal and breath sounds normal. No accessory muscle usage. No respiratory distress. She has no decreased breath sounds. She has no wheezes. She exhibits no tenderness, no bony tenderness, no crepitus, no edema, no deformity and no swelling.    Abdominal: Soft. Bowel sounds are normal. She exhibits no distension. There is no tenderness. There is no rebound and no guarding.  Musculoskeletal: Normal range of motion.       Thoracic back: Normal. She exhibits no tenderness and no bony tenderness.       Right lower leg: Normal. She exhibits no edema.       Left lower leg: Normal. She exhibits no edema.  Neurological: She is alert and oriented to person, place, and time.  Skin: Skin is warm and dry. Capillary refill takes less than 2 seconds.  Psychiatric: She has a normal mood and affect. Her behavior is normal.     ED Treatments / Results  Labs (all labs ordered are listed, but only abnormal results are displayed) Labs Reviewed  BASIC METABOLIC PANEL - Abnormal; Notable for the following components:       Result Value   Creatinine, Ser 1.14 (*)    GFR calc non Af Amer 48 (*)    GFR calc Af Amer 55 (*)    All other components within normal limits  CBC  I-STAT TROPONIN, ED    EKG EKG Interpretation  Date/Time:  Monday February 03 2018 17:51:56 EDT Ventricular Rate:  72 PR Interval:    QRS Duration: 88 QT Interval:  389 QTC Calculation: 426 R Axis:   79 Text Interpretation:  Sinus rhythm No acute changes Nonspecific ST and T wave abnormality No significant change since last tracing Confirmed by Varney Biles [phone]) on 02/03/2018 8:12:44 PM   Radiology Dg Chest 2 View  Result Date: 02/03/2018 CLINICAL DATA:  Short of breath 1 day EXAM: CHEST - 2 VIEW COMPARISON:  None. FINDINGS:  Normal mediastinum and cardiac silhouette. Normal pulmonary vasculature. No evidence of effusion, infiltrate, or pneumothorax. No acute bony abnormality. IMPRESSION: No acute cardiopulmonary process. Electronically Signed   By: Suzy Bouchard M.D.   On: 02/03/2018 18:18    Procedures Procedures (including critical care time)  Medications Ordered in ED Medications - No data to display   Initial Impression / Assessment and Plan / ED Course  I have reviewed the triage vital signs and the nursing notes.  Pertinent labs & imaging results that were available during my care of the patient were reviewed by me and considered in my medical decision making (see chart for details).    Concern for cardiac etiology of Chest Pain. Cardiology has been consulted by Dr. Kathrynn Humble and will see patient in the ED for likely admit.Pt has been re-evaluated prior to consult and VSS, NAD, heart RRR, pain 2/10, lungs CTAB. No acute abnormalities found on EKG and first round of cardiac enzymes negative. This case was discussed with Dr. Kathrynn Humble who has seen the patient and agrees with plan to admit.  Admission consults called by Dr. Kathrynn Humble.  I have ordered the second troponin.  Patient has been admitted by Dr.  Laren Everts.   Final Clinical Impressions(s) / ED Diagnoses   Final diagnoses:  Chest pain, unspecified type    ED Discharge Orders    None       Gari Crown 02/03/18 2330    Varney Biles, MD 02/04/18 1132

## 2018-02-03 NOTE — ED Notes (Signed)
Patient transported to X-ray 

## 2018-02-04 ENCOUNTER — Observation Stay (HOSPITAL_COMMUNITY): Payer: Medicare Other

## 2018-02-04 ENCOUNTER — Encounter (HOSPITAL_COMMUNITY): Payer: Self-pay | Admitting: Internal Medicine

## 2018-02-04 ENCOUNTER — Other Ambulatory Visit: Payer: Self-pay

## 2018-02-04 DIAGNOSIS — E785 Hyperlipidemia, unspecified: Secondary | ICD-10-CM | POA: Diagnosis not present

## 2018-02-04 DIAGNOSIS — Z7982 Long term (current) use of aspirin: Secondary | ICD-10-CM | POA: Diagnosis not present

## 2018-02-04 DIAGNOSIS — I10 Essential (primary) hypertension: Secondary | ICD-10-CM | POA: Diagnosis not present

## 2018-02-04 DIAGNOSIS — R748 Abnormal levels of other serum enzymes: Secondary | ICD-10-CM | POA: Diagnosis not present

## 2018-02-04 DIAGNOSIS — R0789 Other chest pain: Secondary | ICD-10-CM | POA: Diagnosis not present

## 2018-02-04 LAB — TROPONIN I
Troponin I: <0.03 ng/mL (ref <0.03)
Troponin I: <0.03 ng/mL (ref <0.03)
Troponin I: <0.03 ng/mL (ref <0.03)

## 2018-02-04 LAB — LIPID PANEL
CHOL/HDL RATIO: 2.2 ratio
CHOLESTEROL: 153 mg/dL (ref 0–200)
HDL: 70 mg/dL (ref >40)
LDL Cholesterol: 73 mg/dL (ref 0–99)
Triglycerides: 49 mg/dL (ref <150)
VLDL: 10 mg/dL (ref 0–40)

## 2018-02-04 LAB — BASIC METABOLIC PANEL
Anion gap: 9 (ref 5–15)
BUN: 12 mg/dL (ref 8–23)
CHLORIDE: 110 mmol/L (ref 98–111)
CO2: 27 mmol/L (ref 22–32)
Calcium: 9.2 mg/dL (ref 8.9–10.3)
Creatinine, Ser: 0.82 mg/dL (ref 0.44–1.00)
GFR calc Af Amer: >60 mL/min (ref >60)
GFR calc non Af Amer: >60 mL/min (ref >60)
Glucose, Bld: 85 mg/dL (ref 70–99)
POTASSIUM: 3.8 mmol/L (ref 3.5–5.1)
SODIUM: 146 mmol/L — AB (ref 135–145)

## 2018-02-04 LAB — HEPATIC FUNCTION PANEL
ALK PHOS: 56 U/L (ref 38–126)
ALT: 16 U/L (ref 0–44)
AST: 22 U/L (ref 15–41)
Albumin: 3.6 g/dL (ref 3.5–5.0)
BILIRUBIN INDIRECT: 0.7 mg/dL (ref 0.3–0.9)
BILIRUBIN TOTAL: 0.9 mg/dL (ref 0.3–1.2)
Bilirubin, Direct: 0.2 mg/dL (ref 0.0–0.2)
TOTAL PROTEIN: 7.1 g/dL (ref 6.5–8.1)

## 2018-02-04 LAB — HIV ANTIBODY (ROUTINE TESTING W REFLEX): HIV SCREEN 4TH GENERATION: NONREACTIVE

## 2018-02-04 LAB — LIPASE, BLOOD: Lipase: 93 U/L — ABNORMAL HIGH (ref 11–51)

## 2018-02-04 MED ORDER — IOPAMIDOL (ISOVUE-370) INJECTION 76%
INTRAVENOUS | Status: AC
  Filled 2018-02-04: qty 100

## 2018-02-04 NOTE — Progress Notes (Signed)
Progress Note    [name]  WRU:[id number] DOB: [birth year]  DOA: 02/03/2018 PCP: [facility], [facility]    Brief Narrative:    Medical records reviewed and are as summarized below:  [name] is an 72 y.o. female with past medical history significant for hypertension and hyperlipidemia presenting today with right-sided chest pain episodes, on and off that woke her up from sleep today in a.m. and has a pleuritic component.  Associated with shortness of breath and nausea but no cold sweats.  Pain improved after nitro sublingual was given.  Chest pain is crushing and radiating to the back and the right upper part of the abdomen.  It started at 10/10 and decreased to 3/10 upon nitroglycerin sublingual Patient had a stress test around 5 years ago at [facility].     Assessment/Plan:   Active Problems:   Chest pain  Right sided chest pain, worse with deep breathing -lipase elevated, LFTs normal denies alcohol use in last 5 years -does have "weak stomach" -doubt cardiac -check RUQ U/S  Hypertension; controlled      No medications  Hyperlipidemia      Continue with Crestor             Family Communication/Anticipated D/C date and plan/Code Status   DVT prophylaxis: Lovenox ordered. Code Status: Full Code.  Family Communication:  Disposition Plan:    Medical Consultants:    None.     Subjective:   Pain is on right side, worse with deep breathing  Objective:    Vitals:   02/04/18 0003 02/04/18 0425 02/04/18 0807 02/04/18 1328  BP: 118/87 (!) 141/88 (!) 141/70 134/68  Pulse: 75 (!) 58 63 69  Resp: (!) 23 16 19  (!) 23  Temp: 97.9 F (36.6 C) 97.8 F (36.6 C) 97.6 F (36.4 C) 98.6 F (37 C)  TempSrc: Oral Oral Oral Oral  SpO2: 100% 99% 98% 99%  Weight: 64.4 kg (141 lb 15.6 oz)     Height: 5\' 6"  (1.676 m)       Intake/Output Summary (Last 24 hours) at 02/04/2018 1615 Last data filed at 02/04/2018 1329 Gross  per 24 hour  Intake 30 ml  Output 2 ml  Net 28 ml   Filed Weights   02/03/18 2315 02/04/18 0003  Weight: 64.5 kg (142 lb 3.2 oz) 64.4 kg (141 lb 15.6 oz)    Exam: In bed, NAD No pain with palpation in RUQ rrr  Data Reviewed:   I have personally reviewed following labs and imaging studies:  Labs: Labs show the following:   Basic Metabolic Panel: Recent Labs  Lab 02/03/18 1821 02/04/18 0429  NA 142 146*  K 4.7 3.8  CL 108 110  CO2 28 27  GLUCOSE 85 85  BUN 16 12  CREATININE 1.14* 0.82  CALCIUM 9.0 9.2   GFR Estimated Creatinine Clearance: 59.8 mL/min (by C-G formula based on SCr of 0.82 mg/dL). Liver Function Tests: Recent Labs  Lab 02/04/18 0949  AST 22  ALT 16  ALKPHOS 56  BILITOT 0.9  PROT 7.1  ALBUMIN 3.6   Recent Labs  Lab 02/04/18 0949  LIPASE 93*   No results for input(s): AMMONIA in the last 168 hours. Coagulation profile No results for input(s): INR, PROTIME in the last 168 hours.  CBC: Recent Labs  Lab 02/03/18 1821  WBC 6.5  HGB 12.6  HCT 39.0  MCV 92.0  PLT 251   Cardiac Enzymes: Recent Labs  Lab 02/03/18 2344 02/04/18 0429 02/04/18 0949  TROPONINI <0.03 <0.03 <0.03   BNP (last 3 results) No results for input(s): PROBNP in the last 8760 hours. CBG: No results for input(s): GLUCAP in the last 168 hours. D-Dimer: No results for input(s): DDIMER in the last 72 hours. Hgb A1c: No results for input(s): HGBA1C in the last 72 hours. Lipid Profile: Recent Labs    02/04/18 0429  CHOL 153  HDL 70  LDLCALC 73  TRIG 49  CHOLHDL 2.2   Thyroid function studies: No results for input(s): TSH, T4TOTAL, T3FREE, THYROIDAB in the last 72 hours.  Invalid input(s): FREET3 Anemia work up: No results for input(s): VITAMINB12, FOLATE, FERRITIN, TIBC, IRON, RETICCTPCT in the last 72 hours. Sepsis Labs: Recent Labs  Lab 02/03/18 1821  WBC 6.5    Microbiology No results found for this or any previous visit (from the past 240  hour(s)).  Procedures and diagnostic studies:  Dg Chest 2 View  Result Date: 02/03/2018 CLINICAL DATA:  Short of breath 1 day EXAM: CHEST - 2 VIEW COMPARISON:  None. FINDINGS: Normal mediastinum and cardiac silhouette. Normal pulmonary vasculature. No evidence of effusion, infiltrate, or pneumothorax. No acute bony abnormality. IMPRESSION: No acute cardiopulmonary process. Electronically Signed   By: Suzy Bouchard M.D.   On: 02/03/2018 18:18    Medications:   . aspirin  81 mg Oral Daily  . enoxaparin (LOVENOX) injection  40 mg Subcutaneous Q24H  . rosuvastatin  20 mg Oral QHS   Continuous Infusions:   LOS: 0 days   Geradine Girt  [facility]   *Please refer to [email or web address], password [email or web address] to get updated schedule on who will round on this patient, as hospitalists switch teams weekly. If 7PM-7AM, please contact night-coverage at [email or web address], password [email or web address] for any overnight needs.  02/04/2018, 4:15 PM

## 2018-02-04 NOTE — Progress Notes (Signed)
Pt wanted to be discharged. On call provider called. No new orders at this time. Pt and family agitated and angry. Provided pt and family emotional support and therapuetic communication. Will continue to follow.

## 2018-02-05 DIAGNOSIS — R748 Abnormal levels of other serum enzymes: Secondary | ICD-10-CM | POA: Diagnosis not present

## 2018-02-05 DIAGNOSIS — R079 Chest pain, unspecified: Secondary | ICD-10-CM

## 2018-02-05 MED ORDER — ACETAMINOPHEN 325 MG PO TABS: 650.0000 mg | ORAL_TABLET | Freq: Four times a day (QID) | ORAL | Status: DC | PRN

## 2018-02-05 NOTE — Discharge Summary (Signed)
Physician Discharge Summary  [name] YIF:[id number] DOB: [birth year] DOA: 02/03/2018  PCP: [facility]  Admit date: 02/03/2018 Discharge date: 02/05/2018  Admitted From:  Discharge disposition: home   Recommendations for Outpatient Follow-Up:    Outpatient risk factor modification and stress test   Discharge Diagnosis:   Active Problems:   Chest pain    Discharge Condition: Improved.  Diet recommendation: Low sodium, heart healthy  Wound care: None.  Code status: Full.   History of Present Illness:   [name] is an 72 y.o. female with past medical history significant for hypertension and hyperlipidemia presenting today with right-sided chest pain episodes, on and off that woke her up from sleep today in a.m. and has a pleuritic component. Associated with shortness of breath and nausea but no cold sweats. Pain improved after nitro sublingual was given. Chest pain is crushing and radiating to the back and the right upper part of the abdomen. It started at 10/10 and decreased to 3/10 upon nitroglycerin sublingual Patient had a stress test around 5 years ago at [facility] Course by Problem:   Right sided chest pain, worse with deep breathing -lipase elevated, LFTs normal denies alcohol use in last 5 years -does have "weak stomach" -doubt cardiac -RUQ U/S: No acute abnormality  Hypertension; controlled No medications  Hyperlipidemia Continue with Crestor      Medical Consultants:   none   Discharge Exam:   Vitals:   02/05/18 0532 02/05/18 0853  BP: 124/73   Pulse: 66   Resp: 17   Temp: 98.6 F (37 C) 97.8 F (36.6 C)  SpO2: 97%    Vitals:   02/04/18 1328 02/04/18 2125 02/05/18 0532 02/05/18 0853  BP: 134/68 130/62 124/73   Pulse: 69 82 66   Resp: (!) 23 (!) 40 17   Temp: 98.6 F (37 C) 97.9 F (36.6 C) 98.6 F (37 C) 97.8 F (36.6 C)  TempSrc:  Oral Oral Oral Oral  SpO2: 99% 91% 97%   Weight:      Height:        General exam: Appears calm and comfortable. No further discomfort   The results of significant diagnostics from this hospitalization (including imaging, microbiology, ancillary and laboratory) are listed below for reference.     Procedures and Diagnostic Studies:   Dg Chest 2 View  Result Date: 02/03/2018 CLINICAL DATA:  Short of breath 1 day EXAM: CHEST - 2 VIEW COMPARISON:  None. FINDINGS: Normal mediastinum and cardiac silhouette. Normal pulmonary vasculature. No evidence of effusion, infiltrate, or pneumothorax. No acute bony abnormality. IMPRESSION: No acute cardiopulmonary process. Electronically Signed   By: Suzy Bouchard M.D.   On: 02/03/2018 18:18   US Abdomen Complete  Result Date: 02/04/2018 CLINICAL DATA:  Elevated lipase.  Nausea EXAM: ABDOMEN ULTRASOUND COMPLETE COMPARISON:  None. FINDINGS: Gallbladder: No gallstones or wall thickening visualized. No sonographic Murphy sign noted by sonographer. Common bile duct: Diameter: 2.8 cm Liver: No focal lesion identified. Within normal limits in parenchymal echogenicity. Portal vein is patent on color Doppler imaging with normal direction of blood flow towards the liver. IVC: No abnormality visualized. Pancreas: Visualized portion unremarkable. Spleen: Size and appearance within normal limits. Right Kidney: Length: 9.8 cm. 3.6 nonobstructing lower pole calculus. Echogenicity within normal limits. No mass or hydronephrosis visualized. Left Kidney: Length: 10.9 cm. Echogenicity within normal limits. No mass or hydronephrosis visualized. Abdominal aorta: No aneurysm visualized. Other  findings: None. IMPRESSION: No acute abnormality Small nonobstructing  right renal calculus. Electronically Signed   By: Franchot Gallo M.D.   On: 02/04/2018 18:45     Labs:   Basic Metabolic Panel: Recent Labs  Lab 02/03/18 1821 02/04/18 0429  NA 142 146*  K 4.7 3.8  CL 108 110    CO2 28 27  GLUCOSE 85 85  BUN 16 12  CREATININE 1.14* 0.82  CALCIUM 9.0 9.2   GFR Estimated Creatinine Clearance: 59.8 mL/min (by C-G formula based on SCr of 0.82 mg/dL). Liver Function Tests: Recent Labs  Lab 02/04/18 0949  AST 22  ALT 16  ALKPHOS 56  BILITOT 0.9  PROT 7.1  ALBUMIN 3.6   Recent Labs  Lab 02/04/18 0949  LIPASE 93*   No results for input(s): AMMONIA in the last 168 hours. Coagulation profile No results for input(s): INR, PROTIME in the last 168 hours.  CBC: Recent Labs  Lab 02/03/18 1821  WBC 6.5  HGB 12.6  HCT 39.0  MCV 92.0  PLT 251   Cardiac Enzymes: Recent Labs  Lab 02/03/18 2344 02/04/18 0429 02/04/18 0949  TROPONINI <0.03 <0.03 <0.03   BNP: Invalid input(s): POCBNP CBG: No results for input(s): GLUCAP in the last 168 hours. D-Dimer No results for input(s): DDIMER in the last 72 hours. Hgb A1c No results for input(s): HGBA1C in the last 72 hours. Lipid Profile Recent Labs    02/04/18 0429  CHOL 153  HDL 70  LDLCALC 73  TRIG 49  CHOLHDL 2.2   Thyroid function studies No results for input(s): TSH, T4TOTAL, T3FREE, THYROIDAB in the last 72 hours.  Invalid input(s): FREET3 Anemia work up No results for input(s): VITAMINB12, FOLATE, FERRITIN, TIBC, IRON, RETICCTPCT in the last 72 hours. Microbiology No results found for this or any previous visit (from the past 240 hour(s)).   Discharge Instructions:   Discharge Instructions    Diet general   Complete by:  As directed    Increase activity slowly   Complete by:  As directed      Allergies as of 02/05/2018      Reactions   Penicillins Hives   Has patient had a PCN reaction causing immediate rash, facial/tongue/throat swelling, SOB or lightheadedness with hypotension: Yes Has patient had a PCN reaction causing severe rash involving mucus membranes or skin necrosis: No Has patient had a PCN reaction that required hospitalization: Yes Has patient had a PCN reaction  occurring within the last 10 years: No If all of the above answers are "NO", then may proceed with Cephalosporin use.      Medication List    TAKE these medications   acetaminophen 325 MG tablet Commonly known as:  TYLENOL Take 2 tablets (650 mg total) by mouth every 6 (six) hours as needed for mild pain (or Fever >/= 101).   aspirin 81 MG tablet Take 81 mg by mouth daily.   fish oil-omega-3 fatty acids 1000 MG capsule Take 1 g by mouth 3 (three) times daily.   fluticasone 50 MCG/ACT nasal spray Commonly known as:  FLONASE Place 2 sprays into both nostrils daily.   Glucosamine HCl 1000 MG Tabs Take 2,000 mg by mouth daily.   multivitamin tablet Take 1 tablet by mouth daily.   PROBIOTIC PO Take 1 capsule by mouth daily.   rosuvastatin 20 MG tablet Commonly known as:  CRESTOR Take 20 mg by mouth at bedtime.   Vitamin D3 1000 units Caps Take 2,000 Units by  mouth daily.      Follow-up Information    [facility] Follow up in 1 week(s).   Specialty:  Family Medicine           Time coordinating discharge: 35 min  Signed:  Geradine Girt  [facility] 02/05/2018, 3:18 PM

## 2018-02-05 NOTE — Progress Notes (Addendum)
Patient alert and oriented, orders received to d/c home, d/c home in stable condition, PIV and telemetry removed. All d/c instructions given to patient and husband, patient demonstrated understanding the d/c instructions.

## 2018-05-23 ENCOUNTER — Telehealth: Payer: Self-pay | Admitting: Internal Medicine

## 2018-05-23 NOTE — Telephone Encounter (Signed)
Ok with me 

## 2018-05-23 NOTE — Telephone Encounter (Signed)
Ok to schedule with Pyrtle

## 2018-05-23 NOTE — Telephone Encounter (Signed)
See note below and advise. 

## 2018-05-23 NOTE — Telephone Encounter (Signed)
Patient was a patient of Dr Olevia Perches and is due for recall colon. Pt wants to be established with Dr Hilarie Fredrickson states that her PCP referred her to him and wants to see him. Can it be scheduled with Dr Hilarie Fredrickson ?

## 2018-05-26 ENCOUNTER — Encounter: Payer: Self-pay | Admitting: Internal Medicine

## 2018-06-04 ENCOUNTER — Ambulatory Visit (INDEPENDENT_AMBULATORY_CARE_PROVIDER_SITE_OTHER): Payer: Medicare Other | Admitting: Interventional Cardiology

## 2018-06-04 ENCOUNTER — Encounter: Payer: Self-pay | Admitting: Interventional Cardiology

## 2018-06-04 ENCOUNTER — Encounter (INDEPENDENT_AMBULATORY_CARE_PROVIDER_SITE_OTHER): Payer: Self-pay

## 2018-06-04 VITALS — BP 144/70 | HR 99 | Ht 66.0 in | Wt 138.4 lb

## 2018-06-04 DIAGNOSIS — E782 Mixed hyperlipidemia: Secondary | ICD-10-CM | POA: Diagnosis not present

## 2018-06-04 DIAGNOSIS — R072 Precordial pain: Secondary | ICD-10-CM

## 2018-06-04 DIAGNOSIS — R030 Elevated blood-pressure reading, without diagnosis of hypertension: Secondary | ICD-10-CM | POA: Diagnosis not present

## 2018-06-04 MED ORDER — METOPROLOL TARTRATE 100 MG PO TABS: ORAL_TABLET | ORAL | 0 refills | Status: DC

## 2018-06-04 NOTE — Patient Instructions (Addendum)
Medication Instructions:  Your physician recommends that you continue on your current medications as directed. Please refer to the Current Medication list given to you today.  If you need a refill on your cardiac medications before your next appointment, please call your pharmacy.   Lab work: Your physician recommends that you return for lab work for BMET prior to Cardiac CT   If you have labs (blood work) drawn today and your tests are completely normal, you will receive your results only by: Marland Kitchen MyChart Message (if you have MyChart) OR . A paper copy in the mail If you have any lab test that is abnormal or we need to change your treatment, we will call you to review the results.  Testing/Procedures: Your physician has requested that you have cardiac CT. Cardiac computed tomography (CT) is a painless test that uses an x-ray machine to take clear, detailed pictures of your heart. For further information please visit [email or web address]. Please follow instruction sheet as given.  Follow-Up: Based on test results  Any Other Special Instructions Will Be Listed Below (If Applicable).   CARDIAC CT INSTRUCTIONS  Please arrive at the [facility] main entrance of [facility] at xx:xx AM (30-45 minutes prior to test start time)  [facility] [place], [place] [address] [phone]  Proceed to the [facility] Radiology Department (First Floor).  Please follow these instructions carefully (unless otherwise directed):   On the Night Before the Test: . Be sure to Drink plenty of water. . Do not consume any caffeinated/decaffeinated beverages or chocolate 12 hours prior to your test. . Do not take any antihistamines 12 hours prior to your test.  On the Day of the Test: . Drink plenty of water. Do not drink any water within one hour of the test. . Do not eat any food 4 hours prior to the test. . You may take your regular medications prior to the test.   . Take metoprolol (Lopressor) two hours prior to test.      After the Test: . Drink plenty of water. . After receiving IV contrast, you may experience a mild flushed feeling. This is normal. . On occasion, you may experience a mild rash up to 24 hours after the test. This is not dangerous. If this occurs, you can take Benadryl 25 mg and increase your fluid intake. . If you experience trouble breathing, this can be serious. If it is severe call 911 IMMEDIATELY. If it is mild, please call our office.

## 2018-06-04 NOTE — Progress Notes (Signed)
Cardiology Office Note   Date:  06/04/2018   ID:  [name], DOB August 05, 1945, MRN [id number]  PCP:  Aura Dials, PA-C    No chief complaint on file.  Chest pain  Wt Readings from Last 3 Encounters:  06/04/18 138 lb 6.4 oz (62.8 kg)  02/04/18 141 lb 15.6 oz (64.4 kg)       History of Present Illness: [name] is a 72 y.o. female who is being seen today for the evaluation of chest pain at the request of Medicine, [place]  She has a h/o HTN as well.    She was admitted with chest pain in July 2019.  She had a GI w/u which was negative.  RF modification was recommended.  She ruled out for MI.    Since that time, she has had intermittent chest tightness about once a week.  It is not always related to exertion.  No arm pain.  Occasional nausea.  No vomiting.   In 2015, she had a nuclear stress test that was normal.    No heart problems in siblings.  She was on atenolol for HTN, but this was stopped.  Her BP has been up and down, mostly in the 105-144 mm Hg.   Past Medical History:  Diagnosis Date  . Hyperlipidemia   . Hypertension     No past surgical history on file.   Current Outpatient Medications  Medication Sig Dispense Refill  . acetaminophen (TYLENOL) 325 MG tablet Take 2 tablets (650 mg total) by mouth every 6 (six) hours as needed for mild pain (or Fever >/= 101).    Marland Kitchen aspirin 81 MG tablet Take 81 mg by mouth daily.    . Cholecalciferol (VITAMIN D3) 1000 UNITS CAPS Take 1,000 Units by mouth daily.     Marland Kitchen co-enzyme Q-10 50 MG capsule Take 100 mg by mouth daily.    . fish oil-omega-3 fatty acids 1000 MG capsule Take 1 g by mouth 3 (three) times daily.     . fluticasone (FLONASE) 50 MCG/ACT nasal spray Place 2 sprays into both nostrils daily.    . Glucosamine HCl 1000 MG TABS Take 2,000 mg by mouth daily.    . Multiple Vitamin (MULTIVITAMIN) tablet Take 1 tablet by mouth daily.    . Probiotic Product (PROBIOTIC PO) Take 1 capsule by  mouth daily.    . rosuvastatin (CRESTOR) 20 MG tablet Take 20 mg by mouth at bedtime.     . SUPER B COMPLEX/C PO Take by mouth. Use as directed on bottle     No current facility-administered medications for this visit.     Allergies:   Penicillins    Social History:  The patient  reports that she has never smoked. She has never used smokeless tobacco. She reports that she drank alcohol. She reports that she does not use drugs.   Family History:  The patient's family history includes Alcohol abuse in her mother; Heart attack in her father; Liver disease in her mother.    ROS:  Please see the history of present illness.   Otherwise, review of systems are positive for chest tightness.   All other systems are reviewed and negative.    PHYSICAL EXAM: VS:  BP (!) 144/70   Pulse 99   Ht 5\' 6"  (1.676 m)   Wt 138 lb 6.4 oz (62.8 kg)   SpO2 98%   BMI 22.34 kg/m  , BMI Body mass index is 22.34  kg/m. GEN: Well nourished, well developed, in no acute distress  HEENT: normal  Neck: no JVD, carotid bruits, or masses Cardiac: RRR; no murmurs, rubs, or gallops,no edema  Respiratory:  clear to auscultation bilaterally, normal work of breathing GI: soft, nontender, nondistended, + BS MS: no deformity or atrophy  Skin: warm and dry, no rash Neuro:  Strength and sensation are intact Psych: euthymic mood, full affect   EKG:   The ekg ordered in July 2019 demonstrates NSR, no ST changes   Recent Labs: 02/03/2018: Hemoglobin 12.6; Platelets 251 02/04/2018: ALT 16; BUN 12; Creatinine, Ser 0.82; Potassium 3.8; Sodium 146   Lipid Panel    Component Value Date/Time   CHOL 153 02/04/2018 0429   TRIG 49 02/04/2018 0429   HDL 70 02/04/2018 0429   CHOLHDL 2.2 02/04/2018 0429   VLDL 10 02/04/2018 0429   LDLCALC 73 02/04/2018 0429     Other studies Reviewed: Additional studies/ records that were reviewed today with results demonstrating: hospital records reviewed.   ASSESSMENT AND  PLAN:  1. CHest pain: Plan for CT angiogram of the coronaries.  She had a negative stress test 5 years ago.  Negative GI work-up in July.  Chest tightness has persisted. 2. Hyperlipidemia: Continue Crestor.  LDL 73 in July 2019 3. Borderline blood pressure.  Based on her home blood pressure readings, I do not think she needs medication at this time.  Continue to check blood pressure a couple of times per week.  If she is consistently over 140/90, would consider adding low-dose ACE inhibitor.   Current medicines are reviewed at length with the patient today.  The patient concerns regarding her medicines were addressed.  The following changes have been made:  No change  Labs/ tests ordered today include:  No orders of the defined types were placed in this encounter.   Recommend 150 minutes/week of aerobic exercise Low fat, low carb, high fiber diet recommended  Disposition:   FU in based on test results   Signed, Larae Grooms, MD  06/04/2018 10:47 AM    [facility] [address], [place], [place]  [address] Phone: [phone]; Fax: [phone]

## 2018-06-18 ENCOUNTER — Other Ambulatory Visit: Payer: Medicare Other | Admitting: *Deleted

## 2018-06-18 DIAGNOSIS — R072 Precordial pain: Secondary | ICD-10-CM

## 2018-06-18 HISTORY — PX: CT CTA CORONARY W/CA SCORE W/CM &/OR WO/CM: HXRAD787

## 2018-06-18 LAB — BASIC METABOLIC PANEL
BUN/Creatinine Ratio: 17 (ref 12–28)
BUN: 15 mg/dL (ref 8–27)
CHLORIDE: 103 mmol/L (ref 96–106)
CO2: 23 mmol/L (ref 20–29)
CREATININE: 0.87 mg/dL (ref 0.57–1.00)
Calcium: 9.7 mg/dL (ref 8.7–10.3)
GFR calc Af Amer: 78 mL/min/{1.73_m2} (ref >59)
GFR calc non Af Amer: 68 mL/min/{1.73_m2} (ref >59)
GLUCOSE: 93 mg/dL (ref 65–99)
Potassium: 4.1 mmol/L (ref 3.5–5.2)
SODIUM: 143 mmol/L (ref 134–144)

## 2018-06-23 ENCOUNTER — Ambulatory Visit (HOSPITAL_COMMUNITY)
Admission: RE | Admit: 2018-06-23 | Discharge: 2018-06-23 | Disposition: A | Discharge status: Home / Self Care | Payer: Medicare Other | Source: Ambulatory Visit | Attending: Interventional Cardiology | Admitting: Interventional Cardiology

## 2018-06-23 ENCOUNTER — Ambulatory Visit (HOSPITAL_COMMUNITY): Payer: Medicare Other

## 2018-06-23 ENCOUNTER — Encounter (HOSPITAL_COMMUNITY): Payer: Self-pay

## 2018-06-23 ENCOUNTER — Ambulatory Visit: Payer: Self-pay | Admitting: Interventional Cardiology

## 2018-06-23 DIAGNOSIS — R072 Precordial pain: Secondary | ICD-10-CM

## 2018-06-23 MED ORDER — NITROGLYCERIN 0.4 MG SL SUBL
SUBLINGUAL_TABLET | SUBLINGUAL | Status: AC
  Filled 2018-06-23: qty 2

## 2018-06-23 MED ORDER — NITROGLYCERIN 0.4 MG SL SUBL
0.8000 mg | SUBLINGUAL_TABLET | Freq: Once | SUBLINGUAL | Status: DC
  Filled 2018-06-23: qty 25

## 2018-06-23 MED ORDER — IOPAMIDOL (ISOVUE-370) INJECTION 76%
100.0000 mL | Freq: Once | INTRAVENOUS | Status: AC | PRN
  Administered 2018-06-23: 100 mL via INTRAVENOUS

## 2018-06-23 NOTE — Progress Notes (Signed)
Called to evaluate left forearm for possible contrast extravasation - per tech images obtained with contrast, infiltration from IV may possibly be from 50 cc NS.  Patient is not complaining of any pain. She is seated comfortably in a chair in radiology holding area with an ice pack on her arm speaking with RN.  There is moderate firmness just above the IV site. IV has been removed, dressing applied without active bleeding or drainage noted.  No tenderness, erythema, hematoma or skin changes noted.  Full ROM in fingers, wrist, elbow.  Pulses intact.  Recommend elevate right arm for 6-12 hours.  Ice pack 20-60 minutes 4 times per day and as needed for pain.  Patient understands to call or return to ED for worsening pain, any change in skin at affected area, any neurologic symptoms or decreased ROM in the hand or arm.   Candiss Norse, PA-C

## 2018-07-02 ENCOUNTER — Ambulatory Visit (AMBULATORY_SURGERY_CENTER): Payer: Self-pay | Admitting: *Deleted

## 2018-07-02 VITALS — Ht 66.0 in | Wt 139.0 lb

## 2018-07-02 DIAGNOSIS — Z1211 Encounter for screening for malignant neoplasm of colon: Secondary | ICD-10-CM

## 2018-07-02 MED ORDER — NA SULFATE-K SULFATE-MG SULF 17.5-3.13-1.6 GM/177ML PO SOLN: 1.0000 | Freq: Once | ORAL | 0 refills | Status: AC

## 2018-07-02 NOTE — Progress Notes (Signed)
No egg or soy allergy known to patient  No issues with past sedation with any surgeries  or procedures, no intubation problems - hard to wake post op with hysterectomy  No diet pills per patient No home 02 use per patient  No blood thinners per patient  Pt denies issues with constipation  No A fib or A flutter  EMMI video sent to pt's e mail -  I gave [name] a PNM $50 coupon and explained it to her in PV today

## 2018-07-10 ENCOUNTER — Encounter: Payer: Self-pay | Admitting: Internal Medicine

## 2018-07-10 ENCOUNTER — Ambulatory Visit (AMBULATORY_SURGERY_CENTER): Payer: Medicare Other | Admitting: Internal Medicine

## 2018-07-10 VITALS — BP 133/70 | HR 63 | Temp 96.9°F | Resp 14 | Ht 66.0 in | Wt 139.0 lb

## 2018-07-10 DIAGNOSIS — D120 Benign neoplasm of cecum: Secondary | ICD-10-CM

## 2018-07-10 DIAGNOSIS — D123 Benign neoplasm of transverse colon: Secondary | ICD-10-CM | POA: Diagnosis not present

## 2018-07-10 DIAGNOSIS — Z1211 Encounter for screening for malignant neoplasm of colon: Secondary | ICD-10-CM

## 2018-07-10 DIAGNOSIS — K635 Polyp of colon: Secondary | ICD-10-CM | POA: Diagnosis not present

## 2018-07-10 MED ORDER — SODIUM CHLORIDE 0.9 % IV SOLN: 500.0000 mL | Freq: Once | INTRAVENOUS | Status: DC

## 2018-07-10 NOTE — Patient Instructions (Signed)
Handout on polyps, diverticulosis, and hemorrhoids given    YOU HAD AN ENDOSCOPIC PROCEDURE TODAY AT [place]:   Refer to the procedure report that was given to you for any specific questions about what was found during the examination.  If the procedure report does not answer your questions, please call your gastroenterologist to clarify.  If you requested that your care partner not be given the details of your procedure findings, then the procedure report has been included in a sealed envelope for you to review at your convenience later.  YOU SHOULD EXPECT: Some feelings of bloating in the abdomen. Passage of more gas than usual.  Walking can help get rid of the air that was put into your GI tract during the procedure and reduce the bloating. If you had a lower endoscopy (such as a colonoscopy or flexible sigmoidoscopy) you may notice spotting of blood in your stool or on the toilet paper. If you underwent a bowel prep for your procedure, you may not have a normal bowel movement for a few days.  Please Note:  You might notice some irritation and congestion in your nose or some drainage.  This is from the oxygen used during your procedure.  There is no need for concern and it should clear up in a day or so.  SYMPTOMS TO REPORT IMMEDIATELY:   Following lower endoscopy (colonoscopy or flexible sigmoidoscopy):  Excessive amounts of blood in the stool  Significant tenderness or worsening of abdominal pains  Swelling of the abdomen that is new, acute  Fever of 100F or higher   For urgent or emergent issues, a gastroenterologist can be reached at any hour by calling [phone].   DIET:  We do recommend a small meal at first, but then you may proceed to your regular diet.  Drink plenty of fluids but you should avoid alcoholic beverages for 24 hours.  ACTIVITY:  You should plan to take it easy for the rest of today and you should NOT DRIVE or use heavy machinery until  tomorrow (because of the sedation medicines used during the test).    FOLLOW UP: Our staff will call the number listed on your records the next business day following your procedure to check on you and address any questions or concerns that you may have regarding the information given to you following your procedure. If we do not reach you, we will leave a message.  However, if you are feeling well and you are not experiencing any problems, there is no need to return our call.  We will assume that you have returned to your regular daily activities without incident.  If any biopsies were taken you will be contacted by phone or by letter within the next 1-3 weeks.  Please call us at [phone] if you have not heard about the biopsies in 3 weeks.    SIGNATURES/CONFIDENTIALITY: You and/or your care partner have signed paperwork which will be entered into your electronic medical record.  These signatures attest to the fact that that the information above on your After Visit Summary has been reviewed and is understood.  Full responsibility of the confidentiality of this discharge information lies with you and/or your care-partner.

## 2018-07-10 NOTE — Progress Notes (Signed)
A and O x3. Report to RN. Tolerated MAC anesthesia well.

## 2018-07-10 NOTE — Op Note (Signed)
[facility] Patient Name: [name] Procedure Date: 07/10/2018 10:55 AM MRN: [id number] Endoscopist: Jerene Bears , MD Age: 72 Referring MD:  Date of Birth: 08/25/1945 Gender: Female Account #: [id number] Procedure:                Colonoscopy Indications:              Screening for colorectal malignant neoplasm, Last                            colonoscopy 10 years ago Medicines:                Monitored Anesthesia Care Procedure:                Pre-Anesthesia Assessment:                           - Prior to the procedure, a History and Physical                            was performed, and patient medications and                            allergies were reviewed. The patient's tolerance of                            previous anesthesia was also reviewed. The risks                            and benefits of the procedure and the sedation                            options and risks were discussed with the patient.                            All questions were answered, and informed consent                            was obtained. Prior Anticoagulants: The patient has                            taken no previous anticoagulant or antiplatelet                            agents. ASA Grade Assessment: II - A patient with                            mild systemic disease. After reviewing the risks                            and benefits, the patient was deemed in                            satisfactory condition to undergo the procedure.  After obtaining informed consent, the colonoscope                            was passed under direct vision. Throughout the                            procedure, the patient's blood pressure, pulse, and                            oxygen saturations were monitored continuously. The                            Model PCF-H190DL [id number]) scope was introduced                            through the anus and advanced to  the the cecum,                            identified by appendiceal orifice and ileocecal                            valve. The colonoscopy was performed without                            difficulty. The patient tolerated the procedure                            well. The quality of the bowel preparation was                            good. The ileocecal valve, appendiceal orifice, and                            rectum were photographed. Scope In: 11:08:13 AM Scope Out: 11:20:53 AM Scope Withdrawal Time: 0 hours 9 minutes 9 seconds  Total Procedure Duration: 0 hours 12 minutes 40 seconds  Findings:                 The digital rectal exam was normal.                           A 3 mm polyp was found in the cecum. The polyp was                            sessile. The polyp was removed with a cold snare.                            Resection and retrieval were complete.                           A 6 mm polyp was found in the transverse colon. The                            polyp was sessile. The  polyp was removed with a                            cold snare. Resection and retrieval were complete.                           Multiple small and large-mouthed diverticula were                            found in the sigmoid colon, descending colon,                            transverse colon and hepatic flexure.                           Internal hemorrhoids were found during                            retroflexion. The hemorrhoids were medium-sized. Complications:            No immediate complications. Estimated Blood Loss:     Estimated blood loss was minimal. Impression:               - One 3 mm polyp in the cecum, removed with a cold                            snare. Resected and retrieved.                           - One 6 mm polyp in the transverse colon, removed                            with a cold snare. Resected and retrieved.                           - Moderate diverticulosis in the  sigmoid colon, in                            the descending colon, in the transverse colon and                            at the hepatic flexure.                           - Internal hemorrhoids. Recommendation:           - Patient has a contact number available for                            emergencies. The signs and symptoms of potential                            delayed complications were discussed with the                            patient. Return  to normal activities tomorrow.                            Written discharge instructions were provided to the                            patient.                           - Resume previous diet.                           - Continue present medications.                           - Await pathology results.                           - Repeat colonoscopy is recommended for                            surveillance. The colonoscopy date will be                            determined after pathology results from today's                            exam become available for review. Jerene Bears, MD 07/10/2018 11:23:49 AM This report has been signed electronically.

## 2018-07-10 NOTE — Progress Notes (Signed)
Called to room to assist during endoscopic procedure.  Patient ID and intended procedure confirmed with present staff. Received instructions for my participation in the procedure from the performing physician.  

## 2018-07-10 NOTE — Progress Notes (Signed)
Pt's states no medical or surgical changes since previsit or office visit. 

## 2018-07-11 ENCOUNTER — Telehealth: Payer: Self-pay

## 2018-07-11 NOTE — Telephone Encounter (Signed)
  Follow up Call-  Call back number 07/10/2018  Post procedure Call Back phone  # [phone]  Permission to leave phone message Yes  Some recent data might be hidden     Patient questions:  Do you have a fever, pain , or abdominal swelling? No. Pain Score  0 *  Have you tolerated food without any problems? Yes.    Have you been able to return to your normal activities? Yes.    Do you have any questions about your discharge instructions: Diet   No. Medications  No. Follow up visit  No.  Do you have questions or concerns about your Care? No.  Actions: * If pain score is 4 or above: No action needed, pain <4.

## 2018-07-17 ENCOUNTER — Encounter: Payer: Self-pay | Admitting: Internal Medicine

## 2019-09-05 IMAGING — CR DG CHEST 2V
2 series · 2 of 2 positions shown · non-contrast
Comparison: None.

CLINICAL DATA: Short of breath 1 day

EXAM:
CHEST - 2 VIEW

[chest lat]
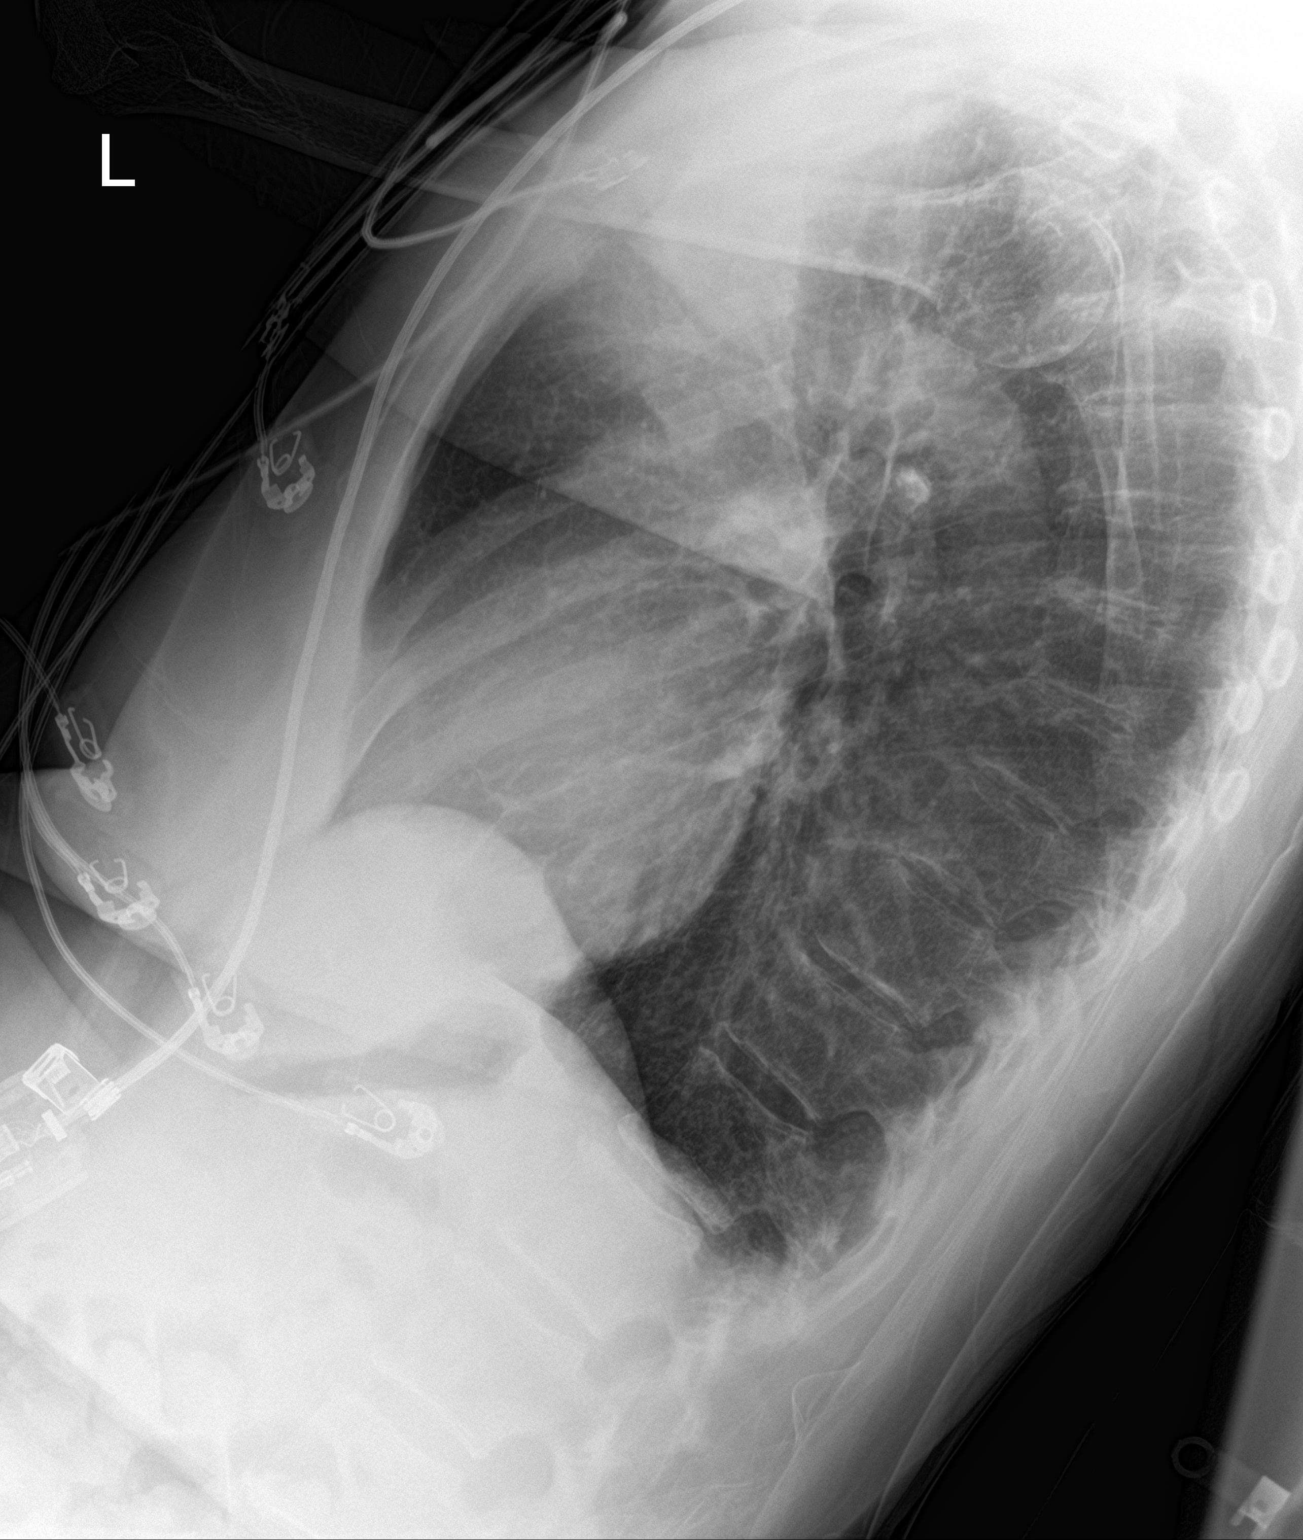

[chest ap]
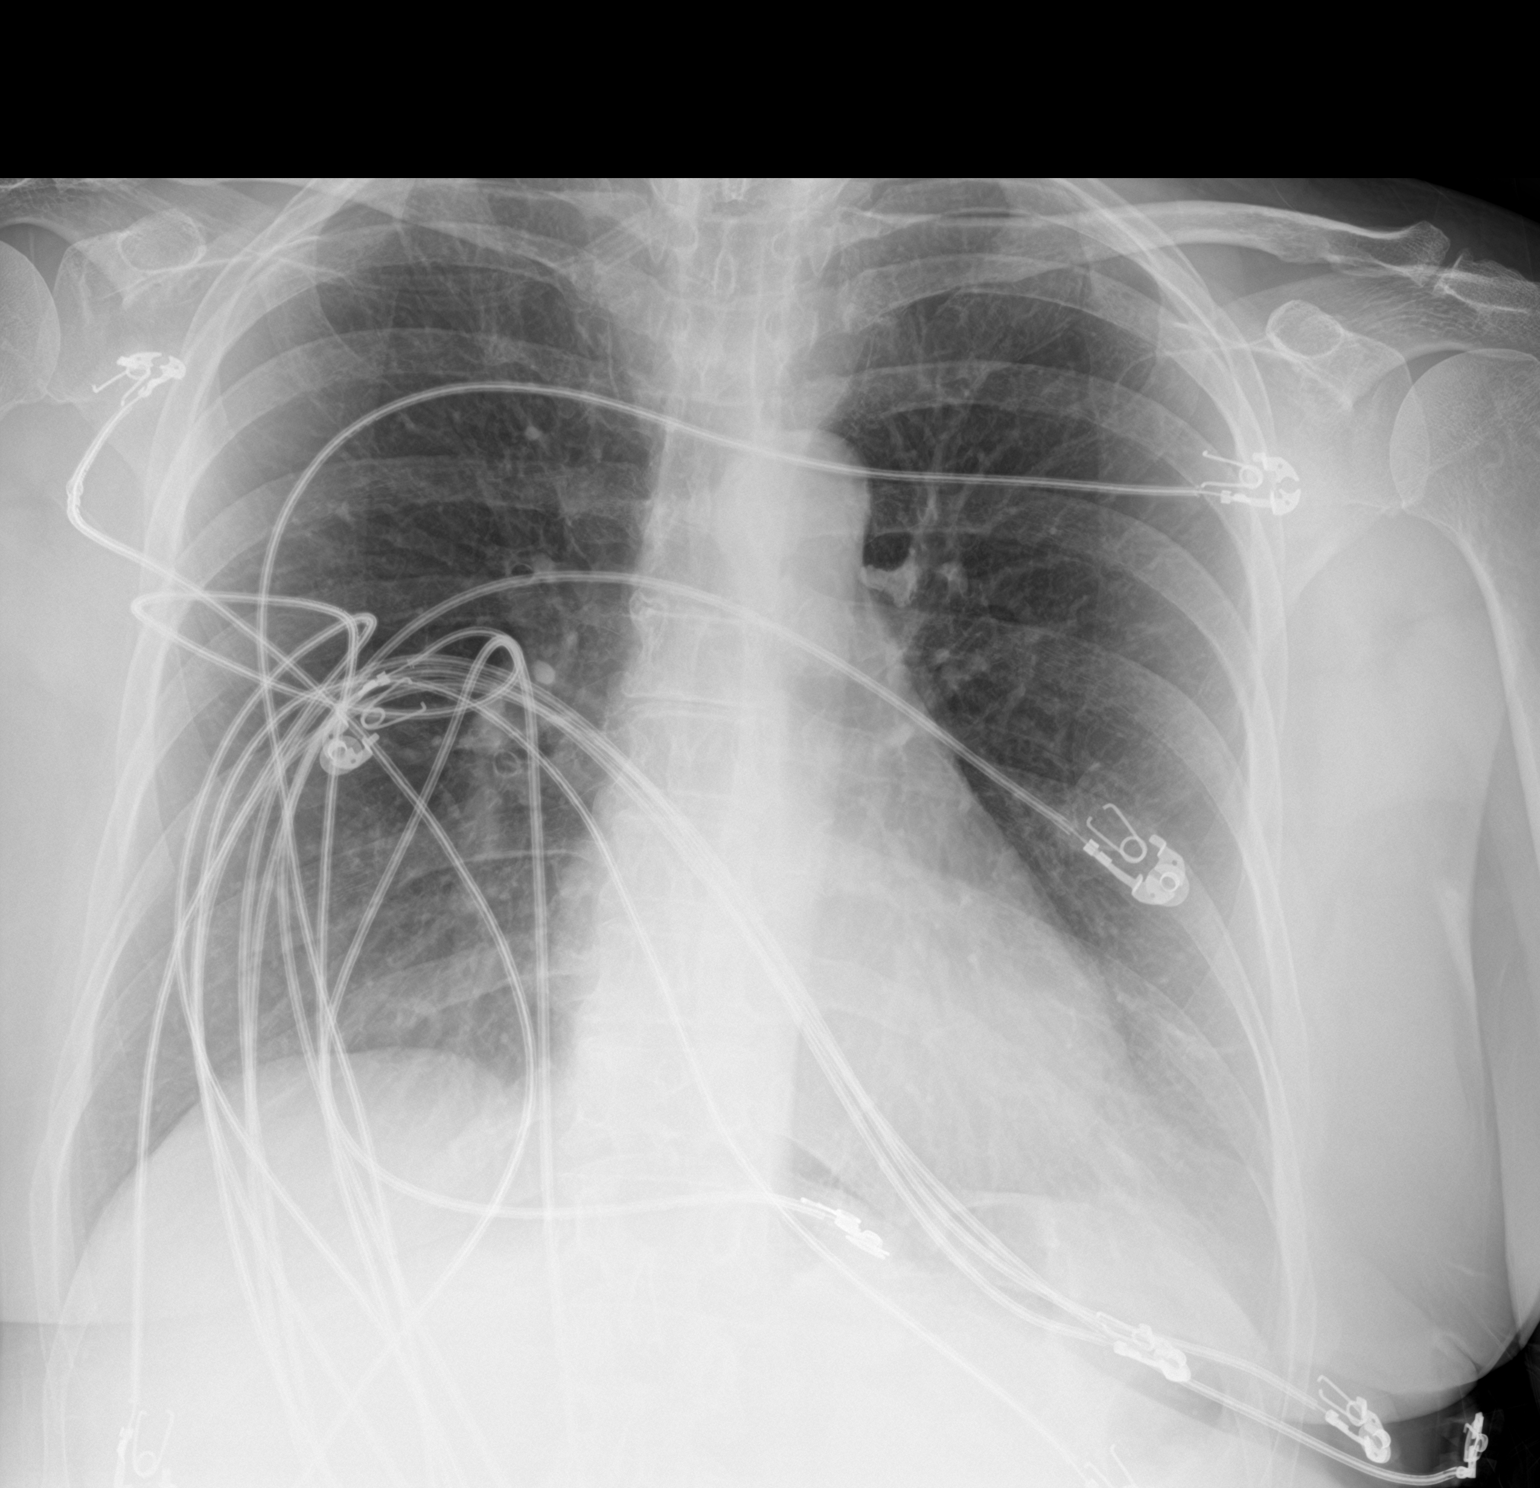

[2 of 2 positions shown; findings below may reference images not displayed]

FINDINGS: Normal mediastinum and cardiac silhouette. Normal pulmonary
vasculature. No evidence of effusion, infiltrate, or pneumothorax.
No acute bony abnormality.
IMPRESSION: No acute cardiopulmonary process.

## 2019-11-27 IMAGING — US US ABDOMEN COMPLETE
1 series · 14 of 25 positions shown · non-contrast
Comparison: None.

CLINICAL DATA: Elevated lipase.  Nausea

EXAM:
ABDOMEN ULTRASOUND COMPLETE

[Series 1: us abdomen complete · 0.16mm/px · 14 of 97 slices shown]
[im 1/97]
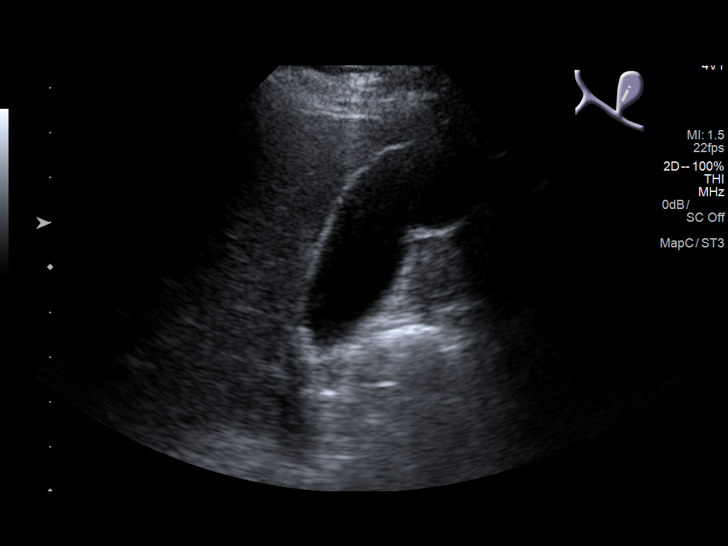
[im 9/97]
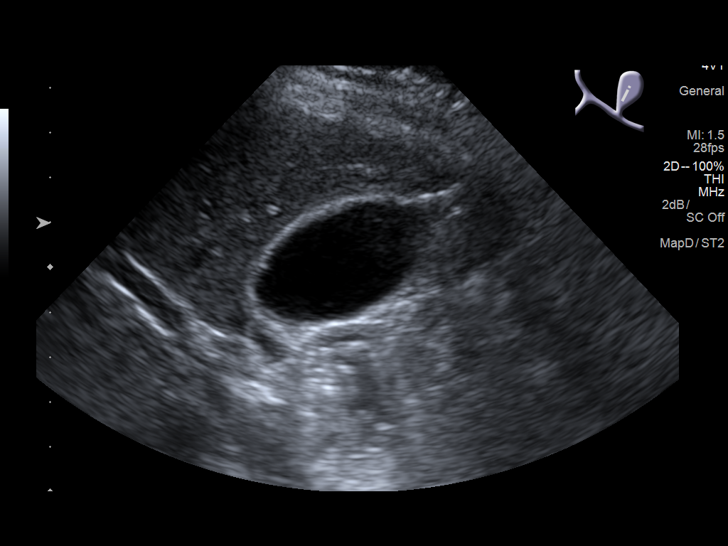
[im 17/97]
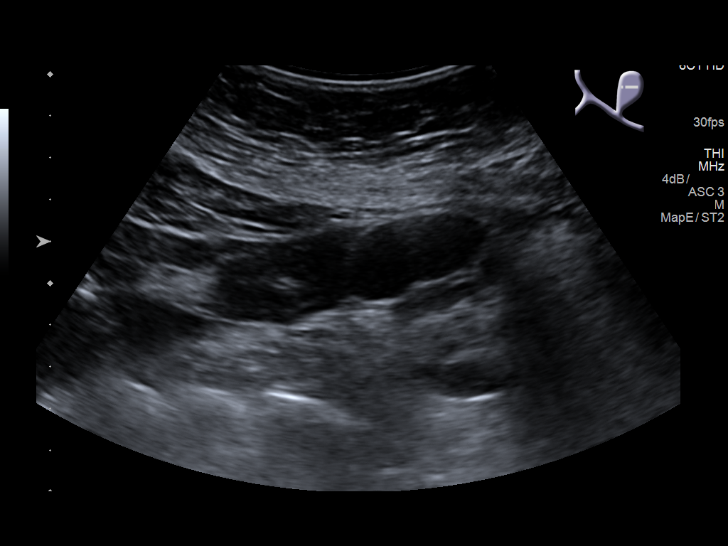
[im 25/97]
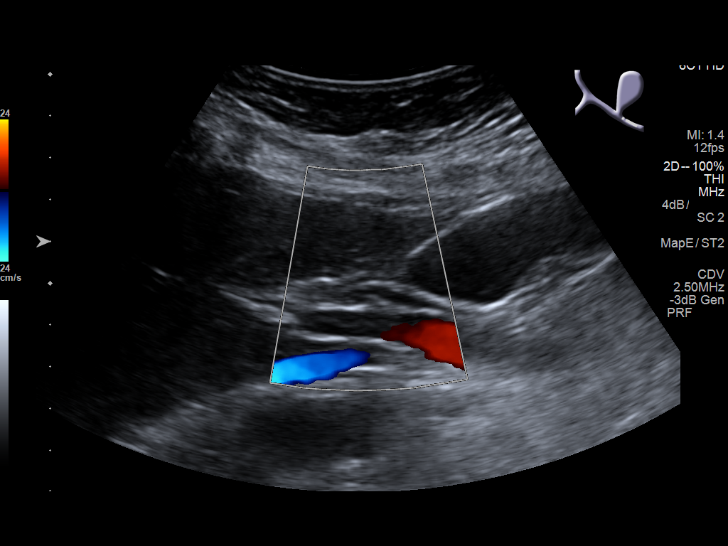
[im 33/97]
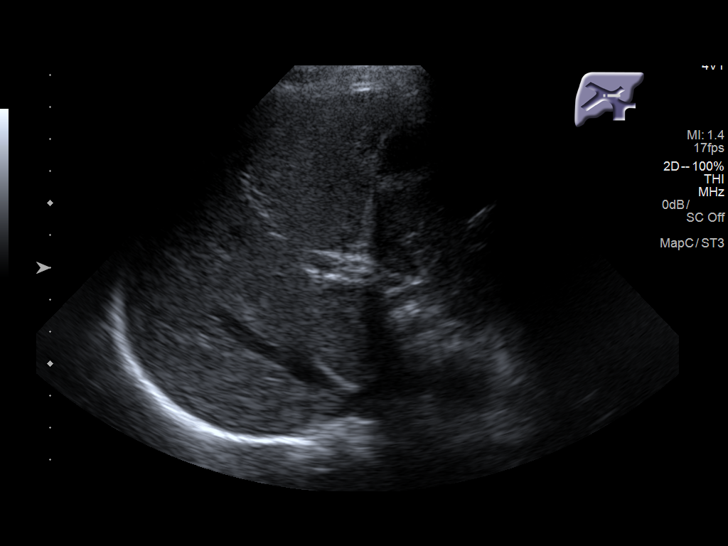
[im 37/97]
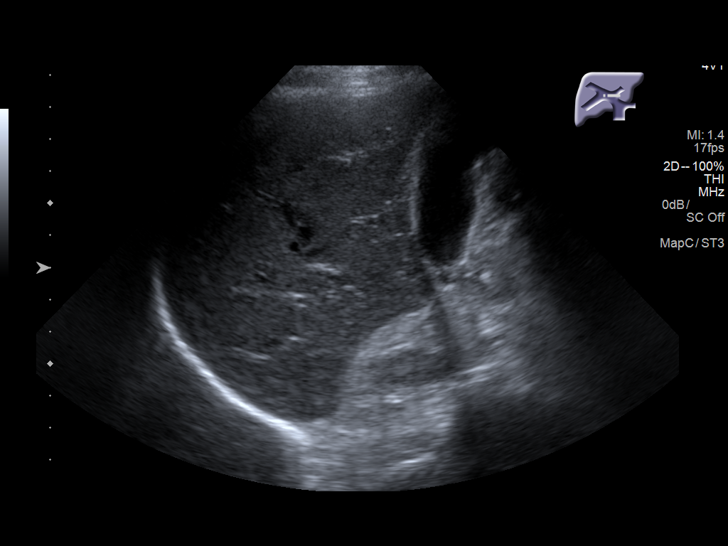
[im 45/97]
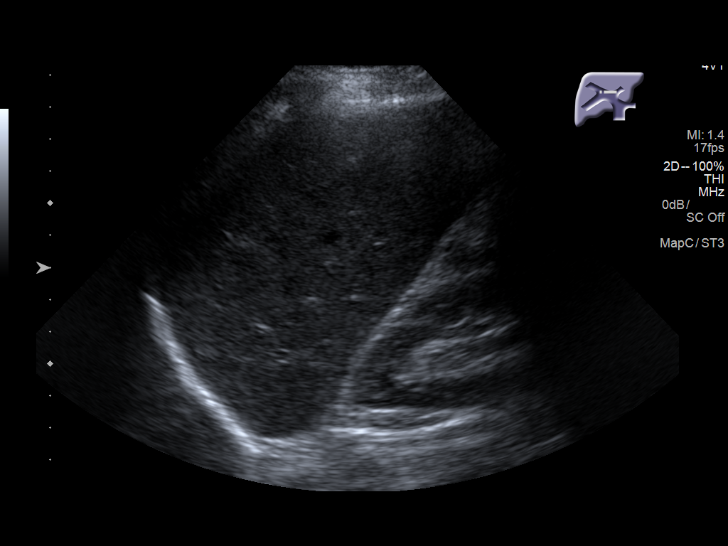
[im 53/97]
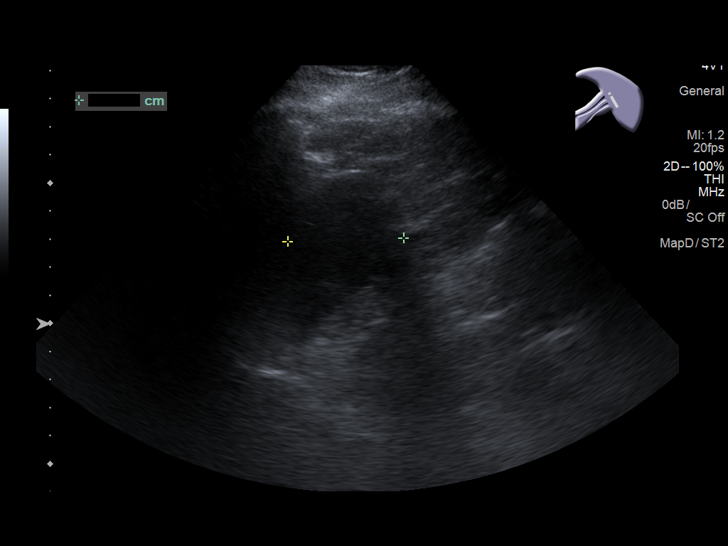
[im 61/97]
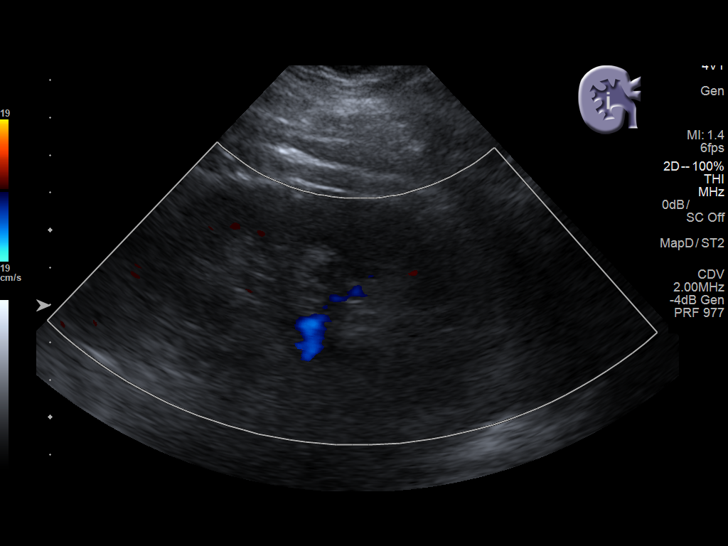
[im 65/97]
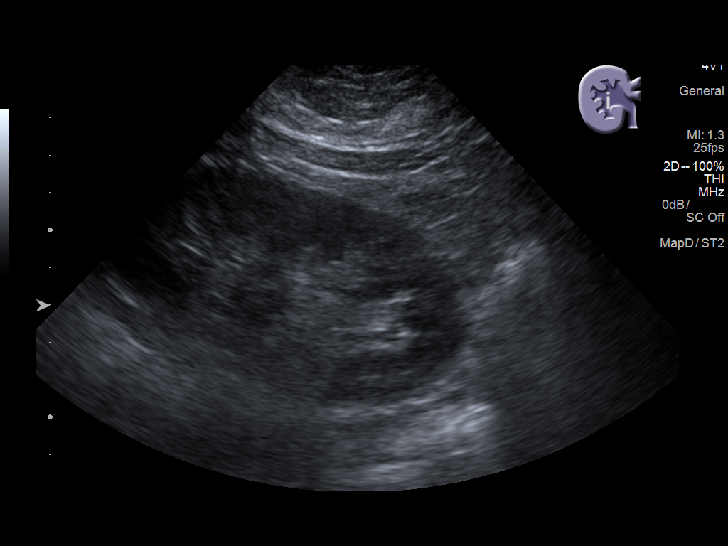
[im 73/97]
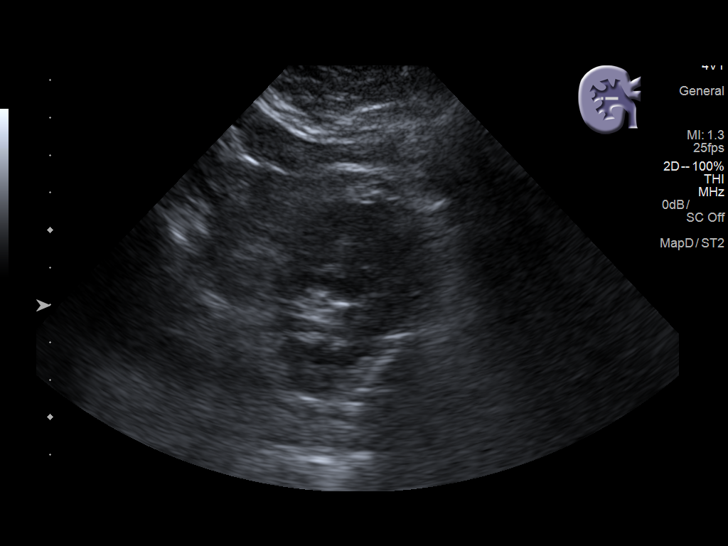
[im 81/97]
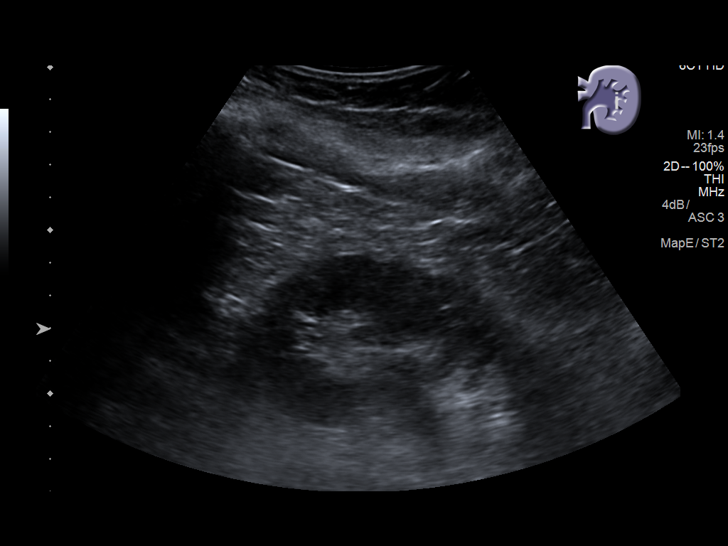
[im 89/97]
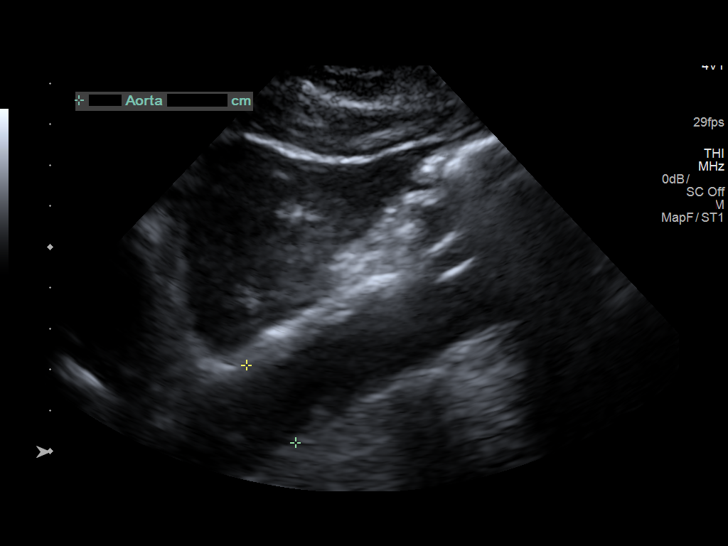
[im 97/97]
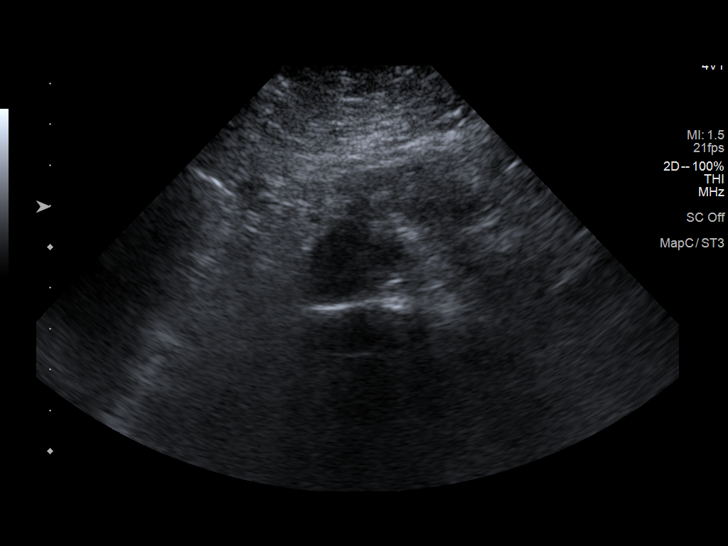

[14 of 25 positions shown; findings below may reference images not displayed]

FINDINGS: Gallbladder: No gallstones or wall thickening visualized. No
sonographic Murphy sign noted by sonographer.

Common bile duct: Diameter: 2.8 cm

Liver: No focal lesion identified. Within normal limits in
parenchymal echogenicity. Portal vein is patent on color Doppler
imaging with normal direction of blood flow towards the liver.

IVC: No abnormality visualized.

Pancreas: Visualized portion unremarkable.

Spleen: Size and appearance within normal limits.

Right Kidney: Length: 9.8 cm. 3.6 nonobstructing lower pole
calculus.. Echogenicity within normal limits. No mass or
hydronephrosis visualized.

Left Kidney: Length: 10.9 cm. Echogenicity within normal limits. No
mass or hydronephrosis visualized.

Abdominal aorta: No aneurysm visualized.

Other findings: None.
IMPRESSION: No acute abnormality

Small nonobstructing  right renal calculus.

## 2020-09-02 ENCOUNTER — Telehealth: Payer: Self-pay

## 2020-09-02 NOTE — Telephone Encounter (Signed)
NOTES ON FILE FROM [facility] [phone], SENT REFERRAL TO SCHEDULING

## 2020-09-08 NOTE — Progress Notes (Unsigned)
Cardiology Office Note   Date:  09/09/2020   ID:  [name], DOB 1945/12/31, MRN [id number]  PCP:  Aura Dials, PA-C    No chief complaint on file.  Chest pain  Wt Readings from Last 3 Encounters:  09/09/20 143 lb 6.4 oz (65 kg)  07/10/18 139 lb (63 kg)  07/02/18 139 lb (63 kg)       History of Present Illness: [name] is a 75 y.o. female  who I saw in 2019 for the evaluation of chest pain.  She has a h/o HTN as well.    She was admitted with chest pain in July 2019.  She had a GI w/u which was negative.  RF modification was recommended.  She ruled out for MI.    Since that time, she has had intermittent chest tightness about once a week.  It is not always related to exertion.  No arm pain.  Occasional nausea.  No vomiting.   In 2015, she had a nuclear stress test that was normal.    No heart problems in siblings.  Due to borderline BP, atenolol was held in 2019.  Plan was for : "If she is consistently over 140/90, would consider adding low-dose ACE inhibitor."   Coronary CT in 2019 showed: "LM: No plaque or stenosis.  LAD system: Soft plaque proximal LAD with mild (<50%) stenosis.  Circumflex system: No plaque or stenosis.  RCA system: Calcified plaque proximal RCA without significant stenosis.  IMPRESSION: 1. Coronary artery calcium score 33 Agatston units. This places the patient in the 58th percentile for age and gender, suggesting intermediate risk for future cardiac events.  2.  Nonobstructive CAD."  In early Feb 2022, she had severe chest pain in the center of her chest while out shopping.  It lasted a few minutes and resolved on its own.   Walking and yardwork are most strenuous activity.  No chest pain with recent activity.    Past Medical History:  Diagnosis Date  . Allergy   . Arthritis    hands and neck   . Diverticulosis 2009   at 2009 colon with DB   . GERD (gastroesophageal reflux disease)    occasioanlly    . Hemorrhoids    at 89 colon with DB   . Hyperlipidemia   . Hypertension    off meds x 1 yr now  . Osteopenia   . Sleep apnea    uses cpap    Past Surgical History:  Procedure Laterality Date  . ABDOMINAL HYSTERECTOMY    . COLONOSCOPY  2009   DB   . 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 CT CTA CORONARY W/CA SCORE W/CM &/OR WO/CM  06/18/2018   no further testing   . NASAL SEPTUM SURGERY    . NASOPHARYNGEAL STENOSIS REPAIR     tohelp with snoring     Current Outpatient Medications  Medication Sig Dispense Refill  . acetaminophen (TYLENOL) 325 MG tablet Take 2 tablets (650 mg total) by mouth every 6 (six) hours as needed for mild pain (or Fever >/= 101).    Marland Kitchen aspirin 81 MG tablet Take 81 mg by mouth daily.    . Cholecalciferol (VITAMIN D3) 1000 UNITS CAPS Take 1,000 Units by mouth daily.     Marland Kitchen co-enzyme Q-10 50 MG capsule Take 100 mg by mouth daily.    . fish oil-omega-3 fatty acids 1000 MG capsule Take 1 g by mouth 3 (three) times daily.     Marland Kitchen  fluticasone (FLONASE) 50 MCG/ACT nasal spray Place 2 sprays into both nostrils daily.    Marland Kitchen Lifitegrast (XIIDRA) 5 % SOLN Place 1 drop into both eyes 2 (two) times daily.    . metoprolol tartrate (LOPRESSOR) 50 MG tablet Take one tablet by mouth 2 hours prior to CT Scan 1 tablet 0  . Multiple Vitamin (MULTIVITAMIN) tablet Take 1 tablet by mouth daily.    . Probiotic Product (PROBIOTIC PO) Take 1 capsule by mouth daily.    . rosuvastatin (CRESTOR) 20 MG tablet Take 20 mg by mouth at bedtime.     . SUPER B COMPLEX/C PO Take by mouth. Use as directed on bottle    . Turmeric 1053 MG TABS Take 1 tablet by mouth daily.     No current facility-administered medications for this visit.    Allergies:   Penicillins    Social History:  The patient  reports that she has never smoked. She has never used smokeless tobacco. She reports previous alcohol use. She reports that she does not use drugs.   Family History:  The patient's family history  includes Alcohol abuse in her mother; Heart attack in her father; Liver disease in her mother.    ROS:  Please see the history of present illness.   Otherwise, review of systems are positive for chest pain.   All other systems are reviewed and negative.    PHYSICAL EXAM: VS:  BP 120/70   Pulse 80   Ht 5\' 6"  (1.676 m)   Wt 143 lb 6.4 oz (65 kg)   SpO2 98%   BMI 23.15 kg/m  , BMI Body mass index is 23.15 kg/m. GEN: Well nourished, well developed, in no acute distress  HEENT: normal  Neck: no JVD, carotid bruits, or masses Cardiac: RRR; no murmurs, rubs, or gallops,no edema  Respiratory:  clear to auscultation bilaterally, normal work of breathing GI: soft, nontender, nondistended, + BS MS: no deformity or atrophy  Skin: warm and dry, no rash Neuro:  Strength and sensation are intact Psych: euthymic mood, full affect   EKG:   The ekg ordered today demonstrates NSR, normal ECG   Recent Labs: No results found for requested labs within last 8760 hours.   Lipid Panel    Component Value Date/Time   CHOL 153 02/04/2018 0429   TRIG 49 02/04/2018 0429   HDL 70 02/04/2018 0429   CHOLHDL 2.2 02/04/2018 0429   VLDL 10 02/04/2018 0429   LDLCALC 73 02/04/2018 0429     Other studies Reviewed: Additional studies/ records that were reviewed today with results demonstrating: Labs reviewed, prior CT reviewed.   ASSESSMENT AND PLAN:  1. Chest pain: Negative cardiac CT in 2019 with mild calcification and disease.  Will plan for the same test again given her sx.  We discussed stress testing versus coronary CTA versus catheterization.  She prefers a noninvasive option given that her symptoms of severe chest pain are not returned.  She has had some milder episodes, but nothing really limiting her exertion at this time.   2. Hyperlipidemia: LDL 82 in 2/22.  CONtinue statin.  3. Borderline BP: The current medical regimen is effective;  continue present plan and medications.   4. PreDM: 6.2  A1C.  Healthy diet.     Current medicines are reviewed at length with the patient today.  The patient concerns regarding her medicines were addressed.  The following changes have been made:  No change  Labs/ tests ordered today include:  Orders Placed This Encounter  Procedures  . CT CORONARY MORPH W/CTA COR W/SCORE W/CA W/CM &/OR WO/CM  . Basic Metabolic Panel (BMET)  . EKG 12-Lead    Recommend 150 minutes/week of aerobic exercise Low fat, low carb, high fiber diet recommended  Disposition:   FUbased on CT results   Signed, Larae Grooms, MD  09/09/2020 5:10 PM    [facility] [place], [place], [place]  [address] Phone: [phone]; Fax: [phone]

## 2020-09-09 ENCOUNTER — Encounter: Payer: Self-pay | Admitting: Interventional Cardiology

## 2020-09-09 ENCOUNTER — Ambulatory Visit (INDEPENDENT_AMBULATORY_CARE_PROVIDER_SITE_OTHER): Payer: Medicare Other | Admitting: Interventional Cardiology

## 2020-09-09 ENCOUNTER — Other Ambulatory Visit: Payer: Self-pay

## 2020-09-09 VITALS — BP 120/70 | HR 80 | Ht 66.0 in | Wt 143.4 lb

## 2020-09-09 DIAGNOSIS — I2584 Coronary atherosclerosis due to calcified coronary lesion: Secondary | ICD-10-CM

## 2020-09-09 DIAGNOSIS — R072 Precordial pain: Secondary | ICD-10-CM

## 2020-09-09 DIAGNOSIS — I2510 Atherosclerotic heart disease of native coronary artery without angina pectoris: Secondary | ICD-10-CM | POA: Diagnosis not present

## 2020-09-09 DIAGNOSIS — E782 Mixed hyperlipidemia: Secondary | ICD-10-CM

## 2020-09-09 DIAGNOSIS — R7303 Prediabetes: Secondary | ICD-10-CM

## 2020-09-09 MED ORDER — METOPROLOL TARTRATE 50 MG PO TABS: ORAL_TABLET | ORAL | 0 refills | Status: DC

## 2020-09-09 NOTE — Patient Instructions (Signed)
Medication Instructions:  Your physician recommends that you continue on your current medications as directed. Please refer to the Current Medication list given to you today.  *If you need a refill on your cardiac medications before your next appointment, please call your pharmacy*   Lab Work: Lab work to be done today--BMP If you have labs (blood work) drawn today and your tests are completely normal, you will receive your results only by: Marland Kitchen MyChart Message (if you have MyChart) OR . A paper copy in the mail If you have any lab test that is abnormal or we need to change your treatment, we will call you to review the results.   Testing/Procedures: Your physician has requested that you have cardiac CT. Cardiac computed tomography (CT) is a painless test that uses an x-ray machine to take clear, detailed pictures of your heart. For further information please visit [email or web address]. Please follow instruction sheet as given.      Follow-Up: At [facility], you and your health needs are our priority.  As part of our continuing mission to provide you with exceptional heart care, we have created designated Provider Care Teams.  These Care Teams include your primary Cardiologist (physician) and Advanced Practice Providers (APPs -  Physician Assistants and Nurse Practitioners) who all work together to provide you with the care you need, when you need it.  We recommend signing up for the patient portal called "MyChart".  Sign up information is provided on this After Visit Summary.  MyChart is used to connect with patients for Virtual Visits (Telemedicine).  Patients are able to view lab/test results, encounter notes, upcoming appointments, etc.  Non-urgent messages can be sent to your provider as well.   To learn more about what you can do with MyChart, go to [email or web address].    Your next appointment:   As needed  The format for your next appointment:   In Person  Provider:    You may see Larae Grooms, MD or one of the following Advanced Practice Providers on your designated Care Team:    Melina Copa, PA-C  Ermalinda Barrios, PA-C    Other Instructions Your cardiac CT will be scheduled at one of the below locations:   [facility] [address] [place], [place] [address] [phone]  [place] [address] [place], [place] [address] [phone]  If scheduled at [facility], please arrive at the [facility] main entrance (entrance A) of [facility] 30 minutes prior to test start time. Proceed to the [facility] Radiology Department (first floor) to check-in and test prep.  If scheduled at [facility], please arrive 15 mins early for check-in and test prep.  Please follow these instructions carefully (unless otherwise directed):    On the Night Before the Test: . Be sure to Drink plenty of water. . Do not consume any caffeinated/decaffeinated beverages or chocolate 12 hours prior to your test. . Do not take any antihistamines 12 hours prior to your test.   On the Day of the Test: . Drink plenty of water until 1 hour prior to the test. . Do not eat any food 4 hours prior to the test. . You may take your regular medications prior to the test.  . Take metoprolol (Lopressor) two hours prior to test. . HOLD Furosemide/Hydrochlorothiazide morning of the test. . FEMALES- please wear underwire-free bra if available        After  the Test: . Drink plenty of water. . After receiving IV contrast, you may experience a mild flushed feeling. This is normal. . On occasion, you may experience a mild rash up to 24 hours after the test. This is not dangerous. If this occurs, you can take Benadryl 25 mg and increase your fluid intake. . If you experience trouble breathing, this can be serious. If it is severe call 911 IMMEDIATELY. If it is mild,  please call our office. . If you take any of these medications: Glipizide/Metformin, Avandament, Glucavance, please do not take 48 hours after completing test unless otherwise instructed.   Once we have confirmed authorization from your insurance company, we will call you to set up a date and time for your test. Based on how quickly your insurance processes prior authorizations requests, please allow up to 4 weeks to be contacted for scheduling your Cardiac CT appointment. Be advised that routine Cardiac CT appointments could be scheduled as many as 8 weeks after your provider has ordered it.  For non-scheduling related questions, please contact the cardiac imaging nurse navigator should you have any questions/concerns: Marchia Bond, Cardiac Imaging Nurse Navigator Gordy Clement, Cardiac Imaging Nurse Navigator [facility] Direct Office Dial: [phone]   For scheduling needs, including cancellations and rescheduling, please call [place], [phone].

## 2020-09-10 LAB — BASIC METABOLIC PANEL
BUN/Creatinine Ratio: 15 (ref 12–28)
BUN: 13 mg/dL (ref 8–27)
CO2: 23 mmol/L (ref 20–29)
Calcium: 10 mg/dL (ref 8.7–10.3)
Chloride: 104 mmol/L (ref 96–106)
Creatinine, Ser: 0.84 mg/dL (ref 0.57–1.00)
GFR calc Af Amer: 80 mL/min/{1.73_m2} (ref >59)
GFR calc non Af Amer: 70 mL/min/{1.73_m2} (ref >59)
Glucose: 93 mg/dL (ref 65–99)
Potassium: 4.3 mmol/L (ref 3.5–5.2)
Sodium: 144 mmol/L (ref 134–144)

## 2020-09-20 ENCOUNTER — Telehealth (HOSPITAL_COMMUNITY): Payer: Self-pay | Admitting: *Deleted

## 2020-09-20 NOTE — Telephone Encounter (Signed)
Reaching out to patient to offer assistance regarding upcoming cardiac imaging study; pt verbalizes understanding of appt date/time, parking situation and where to check in, pre-test NPO status and medications ordered, and verified current allergies; name and call back number provided for further questions should they arise  Killian Ress RN Navigator Cardiac Imaging [facility] [phone] office [phone] cell  

## 2020-09-21 ENCOUNTER — Other Ambulatory Visit: Payer: Self-pay

## 2020-09-21 ENCOUNTER — Encounter (HOSPITAL_COMMUNITY): Payer: Self-pay

## 2020-09-21 ENCOUNTER — Ambulatory Visit (HOSPITAL_COMMUNITY)
Admission: RE | Admit: 2020-09-21 | Discharge: 2020-09-21 | Disposition: A | Discharge status: Home / Self Care | Payer: Medicare Other | Source: Ambulatory Visit | Attending: Interventional Cardiology | Admitting: Interventional Cardiology

## 2020-09-21 DIAGNOSIS — R931 Abnormal findings on diagnostic imaging of heart and coronary circulation: Secondary | ICD-10-CM | POA: Diagnosis not present

## 2020-09-21 DIAGNOSIS — I6521 Occlusion and stenosis of right carotid artery: Secondary | ICD-10-CM | POA: Insufficient documentation

## 2020-09-21 DIAGNOSIS — R072 Precordial pain: Secondary | ICD-10-CM | POA: Insufficient documentation

## 2020-09-21 MED ORDER — NITROGLYCERIN 0.4 MG SL SUBL: 0.8000 mg | SUBLINGUAL_TABLET | Freq: Once | SUBLINGUAL | Status: AC

## 2020-09-21 MED ORDER — IOHEXOL 350 MG/ML SOLN
80.0000 mL | Freq: Once | INTRAVENOUS | Status: AC | PRN
  Administered 2020-09-21: 80 mL via INTRAVENOUS

## 2020-09-21 MED ORDER — NITROGLYCERIN 0.4 MG SL SUBL
SUBLINGUAL_TABLET | SUBLINGUAL | Status: AC
  Administered 2020-09-21: 0.8 mg via SUBLINGUAL
  Filled 2020-09-21: qty 2

## 2021-10-10 ENCOUNTER — Encounter: Payer: Self-pay | Admitting: Internal Medicine

## 2021-11-24 ENCOUNTER — Telehealth: Payer: Self-pay | Admitting: Internal Medicine

## 2021-11-24 NOTE — Telephone Encounter (Signed)
We received a call from patient requesting to speak with nurse regarding symptoms. Per patient, her symptoms are no longer an issue. Patient wanted to know if we recommend her keeping her appointment or not. Please advise.  ?

## 2021-11-24 NOTE — Telephone Encounter (Signed)
Spoke with pt and she states most of her symptoms have resolved but she is still having some pressure in the rectal area. Discussed with pt that it is her decision if she wants to keep the appt or not but if she is having pressure it might be a good idea to keep the appt. Pt states she will make a decision and call back to let us know if she wants to keep the appt or not. ?

## 2021-11-28 ENCOUNTER — Ambulatory Visit: Payer: Medicare Other | Admitting: Internal Medicine

## 2022-04-23 IMAGING — CT CT HEART MORP W/ CTA COR W/ SCORE W/ CA W/CM &/OR W/O CM
4 of 7 series · 8 of 20 positions shown, 9 images · IV contrast (APPLIED)
Comparison: Cardiac CT 06/23/2018.
COMPARISON: Cardiac CT 06/23/2018.

Addendum:
EXAM:
OVER-READ INTERPRETATION  CT CHEST

The following report is an over-read performed by radiologist Dr.
Yun Eusebio [[email or web address]] on 09/21/2020. This
over-read does not include interpretation of cardiac or coronary
anatomy or pathology. The coronary calcium score/coronary CTA
interpretation by the cardiologist is attached.
CLINICAL DATA: 74 year old with chest pain, prior CT coronary in
3140 demonstrated 50% LAD stenosis.
Cardiac/Coronary  CTA
TECHNIQUE: The patient was scanned on a [employer] Force scanner.

[Series 6: best diast 70 % · axial · 0.39mm/px · z∈[+1283,+1322]mm · 2 of 296 slices shown]
[im 99/296  vessel]
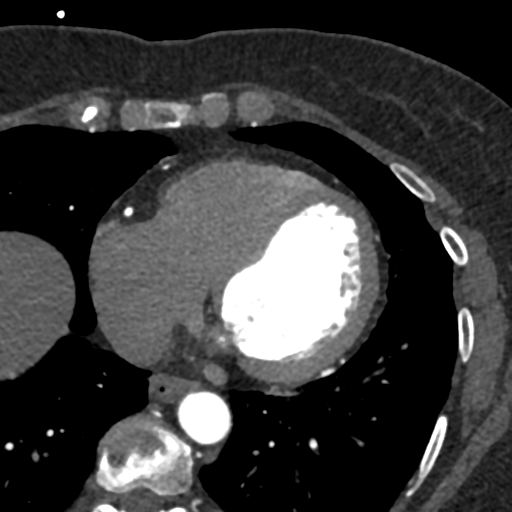
[im 197/296  vessel]
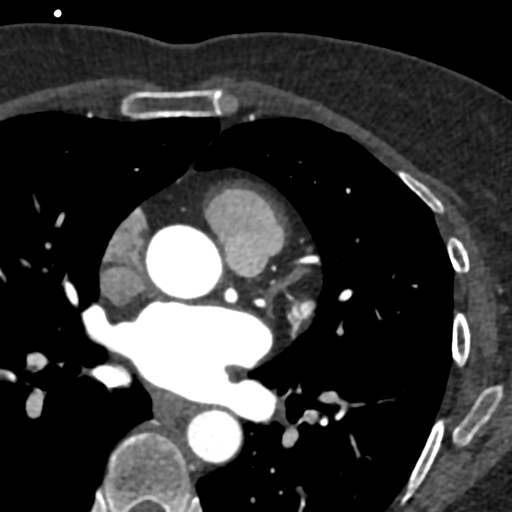

[Series 7: best syst · axial · 0.39mm/px · z∈[+1283,+1322]mm · 2 of 296 slices shown, 3 images]
[im 99/296  vessel]
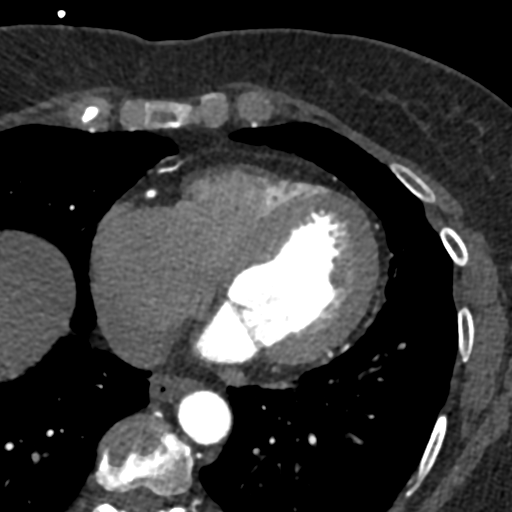
[im 99/296  lung]
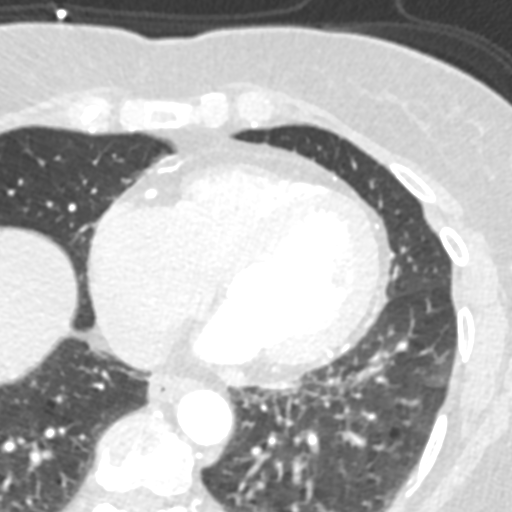
[im 197/296  vessel]
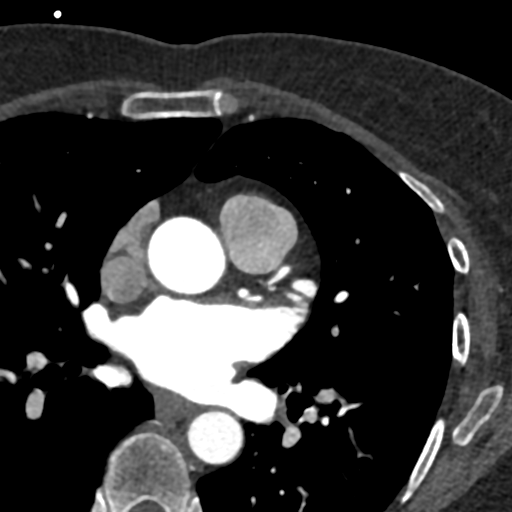

[Series 8: ts diast sharp 70 % · axial · 0.39mm/px · z∈[+1283,+1322]mm · 2 of 296 slices shown]
[im 99/296  lung]
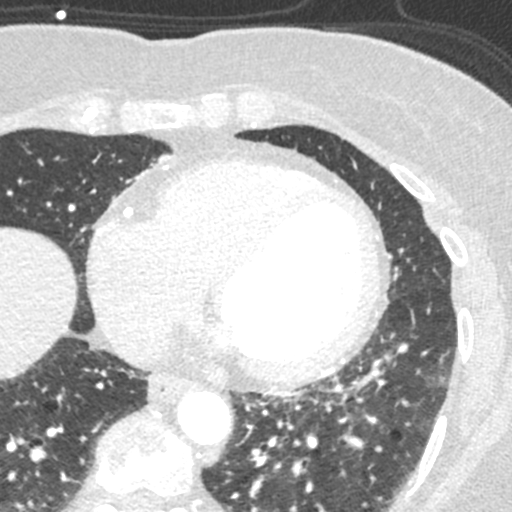
[im 197/296  lung]
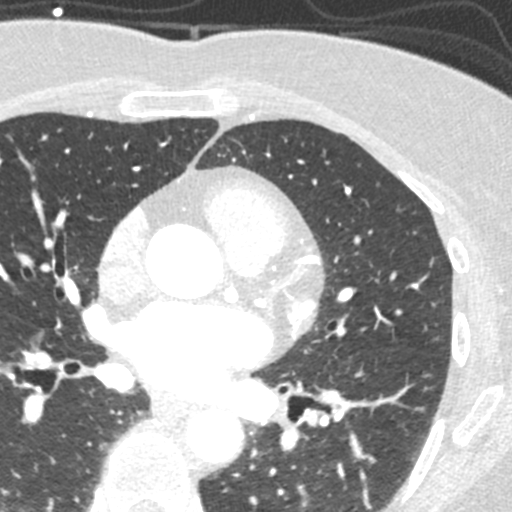

[Series 9: ts syst sharp · axial · 0.39mm/px · z∈[+1283,+1322]mm · 2 of 296 slices shown]
[im 99/296  lung]
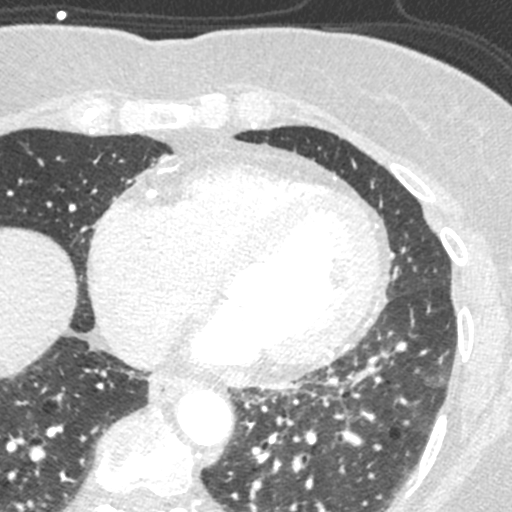
[im 197/296  lung]
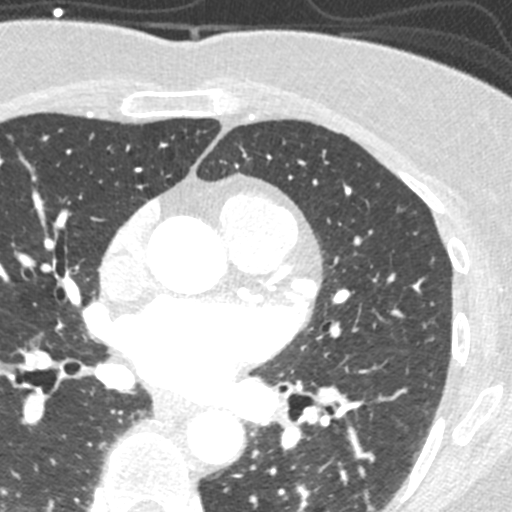

[8 of 20 positions shown; findings below may reference images not displayed]

FINDINGS: Within the visualized portions of the thorax there are no suspicious
appearing pulmonary nodules or masses, there is no acute
consolidative airspace disease, no pleural effusions, no
pneumothorax and no lymphadenopathy. Visualized portions of the
upper abdomen are unremarkable. There are no aggressive appearing
lytic or blastic lesions noted in the visualized portions of the
skeleton.
IMPRESSION: 1. No significant incidental noncardiac findings are noted.
FINDINGS: A 120 kV prospective scan was triggered in the descending thoracic
aorta at 111 HU's. Axial non-contrast 3 mm slices were carried out
through the heart. The data set was analyzed on a dedicated work
station and scored using the Agatson method. Gantry rotation speed
was 250 msecs and collimation was .6 mm. No beta blockade and 0.8 mg
of sl NTG was given. The 3D data set was reconstructed in 5%
intervals of the 67-82 % of the R-R cycle. Diastolic phases were
analyzed on a dedicated work station using MPR, MIP and VRT modes.
The patient received 80 cc of contrast.

Aorta:  Normal size.  No calcifications.  No dissection.

Aortic Valve: Trileaflet. Mild focal annular calcification (non
coronary cusp).

Coronary Arteries:  Normal coronary origin.  Right dominance.

RCA is a large dominant artery that gives rise to PDA and PLA. There
is small focal calcified plaque in mid region, 0-24%.

Left main is a large artery that gives rise to LAD and LCX arteries.

LAD is a large vessel that has focal non calcified plaque, 40-50%
stenosis. No change from prior CT 3140.

LCX is a non-dominant artery that gives rise to one large OM1
branch. There is no plaque.

Other findings:

Normal pulmonary vein drainage into the left atrium.

Normal left atrial appendage without a thrombus.

Normal size of the pulmonary artery.

Please see radiology report for non cardiac findings.
IMPRESSION: 1. Coronary calcium score of 39. This was 55 percentile for age and
sex matched control. Prior 3140 was score 33, 58%.

2. Normal coronary origin with right dominance.

3. LAD is a large vessel that has focal proximal/mid non calcified
plaque, 40-50% stenosis. No change from prior CT 3140. Will send for
FFR to confirm non flow limiting disease.

4.  RCA small focal calcified plaque in mid region, 0-24% stenosis.

*** End of Addendum ***
EXAM:
OVER-READ INTERPRETATION  CT CHEST

The following report is an over-read performed by radiologist Dr.
Yun Eusebio [REDACTED] on 09/21/2020. This
over-read does not include interpretation of cardiac or coronary
anatomy or pathology. The coronary calcium score/coronary CTA
interpretation by the cardiologist is attached.
FINDINGS: Within the visualized portions of the thorax there are no suspicious
appearing pulmonary nodules or masses, there is no acute
consolidative airspace disease, no pleural effusions, no
pneumothorax and no lymphadenopathy. Visualized portions of the
upper abdomen are unremarkable. There are no aggressive appearing
lytic or blastic lesions noted in the visualized portions of the
skeleton.
IMPRESSION: 1. No significant incidental noncardiac findings are noted.

## 2023-04-18 ENCOUNTER — Encounter: Payer: Self-pay | Admitting: Internal Medicine

## 2023-07-12 ENCOUNTER — Ambulatory Visit (AMBULATORY_SURGERY_CENTER): Payer: Medicare Other

## 2023-07-12 VITALS — Ht 65.0 in | Wt 135.0 lb

## 2023-07-12 DIAGNOSIS — Z1211 Encounter for screening for malignant neoplasm of colon: Secondary | ICD-10-CM

## 2023-07-12 NOTE — Progress Notes (Signed)

## 2023-07-22 ENCOUNTER — Telehealth: Payer: Self-pay | Admitting: Internal Medicine

## 2023-07-22 MED ORDER — NA SULFATE-K SULFATE-MG SULF 17.5-3.13-1.6 GM/177ML PO SOLN: 1.0000 | Freq: Once | ORAL | 0 refills | Status: DC

## 2023-07-22 MED ORDER — NA SULFATE-K SULFATE-MG SULF 17.5-3.13-1.6 GM/177ML PO SOLN: 1.0000 | Freq: Once | ORAL | 0 refills | Status: AC

## 2023-07-22 NOTE — Telephone Encounter (Signed)
Patient called and stated that she actually needs to correct the pharmacy for her su-prep to be sent over. Patient is wanting the su-prep to go to [facility] in [place]. Please advise.

## 2023-07-22 NOTE — Addendum Note (Signed)
Addended by: Jaquelyn Bitter on: 07/22/2023 04:39 PM   Modules accepted: Orders

## 2023-07-22 NOTE — Addendum Note (Signed)
Addended by: Jaquelyn Bitter on: 07/22/2023 04:47 PM   Modules accepted: Orders

## 2023-07-22 NOTE — Telephone Encounter (Signed)
Inbound call from patient, would like suprep medication called into [facility] in [place]. States it has not been called in yet.

## 2023-07-29 ENCOUNTER — Telehealth: Payer: Self-pay | Admitting: Internal Medicine

## 2023-07-29 ENCOUNTER — Encounter: Payer: Self-pay | Admitting: Certified Registered Nurse Anesthetist

## 2023-07-29 NOTE — Telephone Encounter (Signed)
 Patient called requesting benefit info for upcoming procedure for 07/31/23

## 2023-07-31 ENCOUNTER — Ambulatory Visit: Payer: Medicare Other | Admitting: Internal Medicine

## 2023-07-31 ENCOUNTER — Encounter: Payer: Self-pay | Admitting: Internal Medicine

## 2023-07-31 VITALS — BP 119/61 | HR 69 | Temp 97.2°F | Resp 15 | Ht 66.0 in | Wt 135.0 lb

## 2023-07-31 DIAGNOSIS — K5730 Diverticulosis of large intestine without perforation or abscess without bleeding: Secondary | ICD-10-CM

## 2023-07-31 DIAGNOSIS — K635 Polyp of colon: Secondary | ICD-10-CM

## 2023-07-31 DIAGNOSIS — K648 Other hemorrhoids: Secondary | ICD-10-CM | POA: Diagnosis not present

## 2023-07-31 DIAGNOSIS — Z1211 Encounter for screening for malignant neoplasm of colon: Secondary | ICD-10-CM

## 2023-07-31 DIAGNOSIS — D122 Benign neoplasm of ascending colon: Secondary | ICD-10-CM

## 2023-07-31 MED ORDER — SODIUM CHLORIDE 0.9 % IV SOLN: 500.0000 mL | Freq: Once | INTRAVENOUS | Status: DC

## 2023-07-31 NOTE — Progress Notes (Signed)
 Report given to PACU, vss

## 2023-07-31 NOTE — Progress Notes (Signed)
 Pt's states no medical or surgical changes since previsit or office visit.

## 2023-07-31 NOTE — Progress Notes (Signed)
 Called to room to assist during endoscopic procedure.  Patient ID and intended procedure confirmed with present staff. Received instructions for my participation in the procedure from the performing physician.

## 2023-07-31 NOTE — Patient Instructions (Addendum)

## 2023-07-31 NOTE — Progress Notes (Signed)
 GASTROENTEROLOGY PROCEDURE H&P NOTE   Primary Care Physician: Jacques Camie Pepper, PA-C    Reason for Procedure:  History of colon polyps  Plan:    Colonoscopy  Patient is appropriate for endoscopic procedure(s) in the ambulatory ([facility]) setting.  The nature of the procedure, as well as the risks, benefits, and alternatives were carefully and thoroughly reviewed with the patient. Ample time for discussion and questions allowed. The patient understood, was satisfied, and agreed to proceed.     HPI: [name] is a 78 y.o. female who presents for colonoscopy.  Medical history as below.  Tolerated the prep.  No recent chest pain or shortness of breath.  No abdominal pain today.  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 Authorizing Provider  fluticasone (FLONASE) 50 MCG/ACT nasal spray Place 2 sprays into both nostrils daily.   Yes [provider]  rosuvastatin  (CRESTOR ) 20 MG tablet Take 20 mg by mouth at bedtime.    Yes [provider]  acetaminophen  (TYLENOL ) 325 MG tablet Take 2 tablets (650 mg total) by mouth every 6 (six) hours as needed for mild pain (or Fever >/= 101). 02/05/18   Vann, Jessica U, DO  aspirin  81 MG tablet Take 81 mg by mouth daily.    [provider]   Cholecalciferol (VITAMIN D3) 1000 UNITS CAPS Take 1,000 Units by mouth daily.     [provider]  co-enzyme Q-10 50 MG capsule Take 100 mg by mouth daily.    [provider]  fish oil-omega-3 fatty acids 1000 MG capsule Take 1 g by mouth 3 (three) times daily.     [provider]  Lifitegrast CARON) 5 % SOLN Place 1 drop into both eyes 2 (two) times daily. 07/08/20   [provider]  magnesium oxide (MAG-OX) 400 MG tablet Take by mouth.    [provider]  Menaquinone-7 (K2 PO) Take by mouth.    [provider]  metoprolol  tartrate (LOPRESSOR ) 50 MG tablet Take one tablet by mouth 2 hours prior to CT Scan 09/09/20   Dann Candyce RAMAN, MD  Multiple Vitamin (MULTIVITAMIN) tablet Take 1 tablet by mouth daily.    [provider]  Probiotic Product (PROBIOTIC PO) Take 1 capsule by mouth daily.    [provider]  SUPER B COMPLEX/C PO Take by mouth. Use as directed on bottle    [provider]  Turmeric 1053 MG TABS Take 1 tablet by mouth daily.    [provider]  ZINC METHIONATE PO Take by mouth.  [provider]    Current Outpatient Medications  Medication Sig Dispense Refill   fluticasone (FLONASE) 50 MCG/ACT nasal spray Place 2 sprays into both nostrils daily.     rosuvastatin  (CRESTOR ) 20 MG tablet Take 20 mg by mouth at bedtime.      acetaminophen  (TYLENOL ) 325 MG tablet Take 2 tablets (650 mg total) by mouth every 6 (six) hours as needed for mild pain (or Fever >/= 101).     aspirin  81 MG tablet Take 81 mg by mouth daily.     Cholecalciferol (VITAMIN D3) 1000 UNITS CAPS Take 1,000 Units by mouth daily.      co-enzyme Q-10 50 MG capsule Take 100 mg by mouth daily.     fish oil-omega-3 fatty acids 1000 MG capsule Take 1 g by mouth 3 (three) times daily.      Lifitegrast (XIIDRA) 5 % SOLN Place 1 drop into both eyes 2 (two) times daily.     magnesium oxide (MAG-OX) 400 MG tablet Take by  mouth.     Menaquinone-7 (K2 PO) Take by mouth.     metoprolol  tartrate (LOPRESSOR ) 50 MG tablet Take one tablet by mouth 2 hours prior to CT Scan 1 tablet 0   Multiple Vitamin (MULTIVITAMIN) tablet Take 1 tablet by mouth daily.     Probiotic Product (PROBIOTIC PO) Take 1 capsule by mouth daily.     SUPER B COMPLEX/C PO Take by mouth. Use as directed on bottle     Turmeric 1053 MG TABS Take 1 tablet by mouth daily.     ZINC METHIONATE PO Take by mouth.     Current Facility-Administered Medications  Medication Dose Route Frequency Provider Last Rate Last Admin   0.9 %  sodium chloride  infusion  500 mL Intravenous Once Nas Wafer, Gordy HERO, MD        Allergies as of 07/31/2023 - Review Complete 07/31/2023  Allergen Reaction Noted   Penicillins Hives 06/15/2008    Family History  Problem Relation Age of Onset   Liver disease Mother    Alcohol abuse Mother    Heart attack Father    Breast cancer Sister    Colon cancer Neg Hx    Colon polyps Neg Hx    Esophageal cancer Neg Hx    Rectal cancer Neg Hx    Stomach cancer Neg Hx     Social History   Socioeconomic History   Marital status: Married    Spouse name: Not on file   Number of children: Not on file   Years of education: Not on file   Highest education level: Not on file  Occupational History   Not on file  Tobacco Use   Smoking status: Never   Smokeless tobacco: Never  Vaping Use   Vaping status: Never Used  Substance and Sexual Activity   Alcohol use: Not Currently    Comment: quit 5 years ago   Drug use: Never   Sexual activity: Not on file  Other Topics Concern   Not on file  Social History Narrative   Not on file   Social Drivers of Health   Financial Resource Strain: Low Risk  (12/25/2022)   Received from [facility]   Overall Financial Resource Strain (CARDIA)    Difficulty of Paying Living Expenses: Not hard at all  Food Insecurity: No Food Insecurity (12/25/2022)   Received from [facility]   Hunger  Vital Sign    Worried About Running Out of Food in the Last  Year: Never true    Ran Out of Food in the Last Year: Never true  Transportation Needs: No Transportation Needs (12/25/2022)   Received from [facility] - Transportation    Lack of Transportation (Medical): No    Lack of Transportation (Non-Medical): No  Physical Activity: Insufficiently Active (12/25/2022)   Received from [facility]   Exercise Vital Sign    Days of Exercise per Week: 2 days    Minutes of Exercise per Session: 30 min  Stress: No Stress Concern Present (12/25/2022)   Received from [facility] - Occupational Stress Questionnaire    Feeling of Stress : Not at all  Social Connections: Socially Integrated (12/25/2022)   Received from [facility]   Social Network    How would you rate your social network (family, work, friends)?: Good participation with social networks  Intimate Partner Violence: Not At Risk (12/25/2022)   Received from [facility]   HITS    Over the last 12 months how often did your partner physically hurt you?: Never    Over the last 12 months how often did your partner insult you or talk down to you?: Sometimes    Over the last 12 months how often did your partner threaten you with physical harm?: Never    Over the last 12 months how often did your partner scream or curse at you?: Never    Physical Exam: Vital signs in last 24 hours: @BP  (!) 153/79   Pulse 65   Temp (!) 97.2 F (36.2 C)   Resp 13   Ht 5' 6 (1.676 m)   Wt 135 lb (61.2 kg)   SpO2 100%   BMI 21.79 kg/m  GEN: NAD EYE: Sclerae anicteric ENT: MMM CV: Non-tachycardic Pulm: CTA b/l GI: Soft, NT/ND NEURO:  Alert & Oriented x 3   Gordy Starch, MD [facility]  07/31/2023 9:50 AM

## 2023-07-31 NOTE — Op Note (Signed)
 [facility] Patient Name: [name] Procedure Date: 07/31/2023 9:49 AM MRN: [id number] Endoscopist: Gordy CHRISTELLA Starch , MD, [id number] Age: 78 Referring MD:  Date of Birth: 1946/05/03 Gender: Female Account #: [id number] Procedure:                Colonoscopy Indications:              High risk colon cancer surveillance: Personal                            history of sessile serrated colon polyp (less than                            10 mm in size) with no dysplasia, Last colonoscopy:                            December 2019 (SSP x 1, TA x1) Medicines:                Monitored Anesthesia Care Procedure:                Pre-Anesthesia Assessment:                           - Prior to the procedure, a History and Physical                            was performed, and patient medications and                            allergies were reviewed. The patient's tolerance of                            previous anesthesia was also reviewed. The risks                            and benefits of the procedure and the sedation                            options and risks were discussed with the patient.                            All questions were answered, and informed consent                            was obtained. Prior Anticoagulants: The patient has                            taken no anticoagulant or antiplatelet agents. ASA                            Grade Assessment: II - A patient with mild systemic                            disease. After reviewing the risks and benefits,  the patient was deemed in satisfactory condition to                            undergo the procedure.                           After obtaining informed consent, the colonoscope                            was passed under direct vision. Throughout the                            procedure, the patient's blood pressure, pulse, and                            oxygen saturations were  monitored continuously. The                            [id number]-HQ190L Colonoscope [id number] was introduced                            through the anus and advanced to the cecum,                            identified by appendiceal orifice and ileocecal                            valve. The colonoscopy was performed without                            difficulty. The patient tolerated the procedure                            well. The quality of the bowel preparation was                            good. The ileocecal valve, appendiceal orifice, and                            rectum were photographed. Scope In: 10:02:27 AM Scope Out: 10:15:05 AM Scope Withdrawal Time: 0 hours 9 minutes 53 seconds  Total Procedure Duration: 0 hours 12 minutes 38 seconds  Findings:                 The digital rectal exam was normal.                           A 7 mm polyp was found in the ascending colon. The                            polyp was sessile. The polyp was removed with a                            cold snare. Resection and retrieval were complete.  Multiple medium-mouthed and small-mouthed                            diverticula were found in the sigmoid colon,                            descending colon, transverse colon and ascending                            colon.                           Internal hemorrhoids were found during                            retroflexion. The hemorrhoids were small. Complications:            No immediate complications. Estimated Blood Loss:     Estimated blood loss: none. Impression:               - One 7 mm polyp in the ascending colon, removed                            with a cold snare. Resected and retrieved.                           - Moderate diverticulosis in the sigmoid colon, in                            the descending colon, in the transverse colon and                            in the ascending colon.                           -  Internal hemorrhoids. Recommendation:           - Patient has a contact number available for                            emergencies. The signs and symptoms of potential                            delayed complications were discussed with the                            patient. Return to normal activities tomorrow.                            Written discharge instructions were provided to the                            patient.                           - Resume previous diet.                           -  Continue present medications.                           - Await pathology results.                           - No recommendation at this time regarding repeat                            colonoscopy due to age at next surveillance                            interval (5 years). Gordy CHRISTELLA Starch, MD 07/31/2023 10:17:38 AM This report has been signed electronically.

## 2023-08-01 ENCOUNTER — Telehealth: Payer: Self-pay

## 2023-08-01 NOTE — Telephone Encounter (Signed)
  Follow up Call-     07/31/2023    8:41 AM  Call back number  Post procedure Call Back phone  # [phone]  Permission to leave phone message Yes     Patient questions:  Do you have a fever, pain , or abdominal swelling? No. Pain Score  0 *  Have you tolerated food without any problems? Yes.    Have you been able to return to your normal activities? Yes.    Do you have any questions about your discharge instructions: Diet   No. Medications  No. Follow up visit  No.  Do you have questions or concerns about your Care? No.  Actions: * If pain score is 4 or above: No action needed, pain <4.

## 2023-08-05 ENCOUNTER — Encounter: Payer: Self-pay | Admitting: Internal Medicine

## 2023-08-05 LAB — SURGICAL PATHOLOGY

## 2024-08-13 ENCOUNTER — Inpatient Hospital Stay: Attending: Hematology and Oncology | Admitting: Hematology and Oncology

## 2024-08-13 VITALS — BP 132/60 | HR 82 | Temp 98.9°F | Resp 17 | Ht 66.0 in | Wt 127.3 lb

## 2024-08-13 DIAGNOSIS — Z803 Family history of malignant neoplasm of breast: Secondary | ICD-10-CM | POA: Diagnosis not present

## 2024-08-13 DIAGNOSIS — Z1722 Progesterone receptor negative status: Secondary | ICD-10-CM | POA: Diagnosis not present

## 2024-08-13 DIAGNOSIS — R590 Localized enlarged lymph nodes: Secondary | ICD-10-CM | POA: Diagnosis not present

## 2024-08-13 DIAGNOSIS — R519 Headache, unspecified: Secondary | ICD-10-CM | POA: Diagnosis not present

## 2024-08-13 DIAGNOSIS — Z171 Estrogen receptor negative status [ER-]: Secondary | ICD-10-CM | POA: Diagnosis not present

## 2024-08-13 DIAGNOSIS — Z1732 Human epidermal growth factor receptor 2 negative status: Secondary | ICD-10-CM | POA: Insufficient documentation

## 2024-08-13 DIAGNOSIS — Z90710 Acquired absence of both cervix and uterus: Secondary | ICD-10-CM | POA: Insufficient documentation

## 2024-08-13 DIAGNOSIS — M545 Low back pain, unspecified: Secondary | ICD-10-CM | POA: Insufficient documentation

## 2024-08-13 DIAGNOSIS — C50212 Malignant neoplasm of upper-inner quadrant of left female breast: Secondary | ICD-10-CM | POA: Diagnosis not present

## 2024-08-13 NOTE — Progress Notes (Signed)
 [facility] CONSULT NOTE  Patient Care Team: Jacques Camie Franchot [name] as PCP - General (Physician Assistant) Dann Candyce RAMAN, MD as PCP - Cardiology (Cardiology)  CHIEF COMPLAINTS/PURPOSE OF CONSULTATION:  Newly diagnosed breast cancer  HISTORY OF PRESENTING ILLNESS:  [name] 79 y.o. female is here because of recent diagnosis of left breast IDC  I reviewed her records extensively and collaborated the history with the patient.  SUMMARY OF ONCOLOGIC HISTORY: Oncology History  Malignant neoplasm of upper-inner quadrant of left breast in female, estrogen receptor negative (HCC)  08/13/2024 Initial Diagnosis   Malignant neoplasm of upper-inner quadrant of left breast in female, estrogen receptor negative (HCC)   08/13/2024 Cancer Staging   Staging form: Breast, AJCC 8th Edition - Clinical: Stage IIB (cT1b, cN1, cM0, G3, ER-, PR-, HER2-) - Signed by Loretha Ash, MD on 08/13/2024 Histologic grading system: 3 grade system    Discussed the use of AI scribe software for clinical note transcription with the patient, who gave verbal consent to proceed.  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  She was found to have an abnormality in the upper inner quadrant of the left breast on screening mammography, followed by additional imaging that revealed multiple abnormal left axillary lymph nodes, the largest measuring over 3 cm. She had a normal screening mammogram in December 2025 and was asymptomatic prior to these findings, with no palpable masses, breast pain, nipple discharge, or changes in the breast or axilla. Biopsies of the breast mass and axillary lymph node showed triple negative (ER-, PR-, HER2-) ductal carcinoma. She has not  received any prior oncologic treatment and has not previously met with an oncologist for this diagnosis.  She describes herself as generally healthy and physically active, regularly engaging in yard work, gardening, and exercise classes, including biking five miles in about thirty minutes a couple of times per week until three weeks ago. She has not experienced a significant decline in her functional status. She endorses new onset lower back pain for the past couple of months and intermittent headaches, which she attributes to sinus issues. Back pain is fairly new may have been there for 2 months, headaches may be chronic.  She denies cough, bone pain elsewhere, or other systemic symptoms.  Rest of the pertinent 10 point ROS reviewed and neg.  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   CT CTA CORONARY W/CA SCORE W/CM &/OR WO/CM  06/18/2018   no further testing    NASAL SEPTUM SURGERY     NASOPHARYNGEAL STENOSIS REPAIR     tohelp with snoring    SOCIAL HISTORY: Social History   Socioeconomic  History   Marital status: Married    Spouse name: Not on file   Number of children: Not on file   Years of education: Not on file   Highest education level: Not on file  Occupational History   Not on file  Tobacco Use   Smoking status: Never   Smokeless tobacco: Never  Vaping Use   Vaping status: Never Used  Substance and Sexual Activity   Alcohol use: Not Currently    Comment: quit 5 years ago   Drug use: Never   Sexual  activity: Not on file  Other Topics Concern   Not on file  Social History Narrative   Not on file   Social Drivers of Health   Tobacco Use: Low Risk (07/06/2024)   Received from [facility]   Patient History    Smoking Tobacco Use: Never    Smokeless Tobacco Use: Never    Passive Exposure: Past  Financial Resource Strain: Low Risk (03/27/2024)   Received from [facility]   Overall Financial Resource Strain (CARDIA)    How hard is it for you to pay for the very basics like food, housing, medical care, and heating?: Not hard at all  Food Insecurity: No Food Insecurity (08/13/2024)   Epic    Worried About Radiation Protection Practitioner of Food in the Last Year: Never true    Ran Out of Food in the Last Year: Never true  Transportation Needs: No Transportation Needs (08/13/2024)   Epic    Lack of Transportation (Medical): No    Lack of Transportation (Non-Medical): No  Physical Activity: Insufficiently Active (03/27/2024)   Received from [facility]   Exercise Vital Sign    On average, how many days per week do you engage in moderate to strenuous exercise (like a brisk walk)?: 2 days    On average, how many minutes do you engage in exercise at this level?: 60 min  Stress: No Stress Concern Present (03/27/2024)   Received from [facility] - Occupational Stress Questionnaire    Do you feel stress - tense, restless, nervous, or anxious, or unable to sleep at night because your mind is troubled all the time - these days?: Not at all  Social Connections: Socially Integrated (03/27/2024)   Received from [facility]   Social Network    How would you rate your social network (family, work, friends)?: Good participation with social networks  Intimate Partner Violence: Not At Risk (03/27/2024)   Received from [facility]   HITS    Over the last 12 months how often did your partner physically hurt you?: Never    Over the last 12 months how often did your partner  insult you or talk down to you?: Sometimes    Over the last 12 months how often did your partner threaten you with physical harm?: Never    Over the last 12 months how often did your partner scream or curse at you?: Never  Depression (PHQ2-9): Not on file  Alcohol Screen: Not on file  Housing: Low Risk (08/13/2024)   Epic    Unable to Pay for Housing in the Last Year: No    Number of Times Moved in the Last Year: 0    Homeless in the Last Year: No  Utilities: Not At Risk (08/13/2024)   Epic    Threatened with loss of utilities: No  Health Literacy: Not on file    FAMILY HISTORY: Family History  Problem Relation Age of Onset   Liver disease Mother    Alcohol abuse Mother    Heart attack Father    Breast cancer Sister    Colon cancer Neg Hx    Colon polyps Neg Hx    Esophageal cancer Neg Hx    Rectal cancer Neg Hx    Stomach cancer Neg Hx     ALLERGIES:  is allergic to penicillins.  MEDICATIONS:  Current Outpatient Medications  Medication Sig Dispense Refill   Cholecalciferol (VITAMIN D3) 1000 UNITS CAPS Take 1,000 Units by mouth daily.      co-enzyme Q-10 50 MG capsule Take 100 mg by mouth daily.     fish oil-omega-3 fatty acids 1000 MG capsule Take 1 g by mouth 3 (three) times daily.      fluticasone (FLONASE) 50 MCG/ACT nasal spray Place 2 sprays into both nostrils daily.     magnesium oxide (MAG-OX) 400 MG tablet Take by mouth.     Menaquinone-7 (K2 PO) Take by mouth.     Multiple Vitamin (MULTIVITAMIN) tablet Take 1 tablet by mouth daily.     Probiotic Product (PROBIOTIC PO) Take 1 capsule by mouth daily.     rosuvastatin  (CRESTOR ) 20 MG tablet Take 20 mg by mouth at bedtime.      Turmeric 1053 MG TABS Take 1 tablet by mouth daily.     ZINC METHIONATE PO Take by mouth.     No current facility-administered medications for this visit.   All other systems were reviewed with the patient and are negative.  PHYSICAL EXAMINATION: ECOG PERFORMANCE STATUS: 0 -  Asymptomatic  Vitals:   08/13/24 1525  BP: 132/60  Pulse: 82  Resp: 17  Temp: 98.9 F (37.2 C)  SpO2: 98%   Filed Weights   08/13/24 1525  Weight: 127 lb 4.8 oz (57.7 kg)    GENERAL:alert, no distress and comfortable Left breast mass in the upper inner quadrant small and under a cm Palpable left axillary adenopathy, I could palpate at least 2 LN, non matted. Largest LN is 3.5 cms CTA bilaterally RRR  LABORATORY DATA:  I have reviewed the data as listed Lab Results  Component Value Date   WBC 6.5 02/03/2018   HGB 12.6 02/03/2018   HCT 39.0 02/03/2018   MCV 92.0 02/03/2018   PLT 251 02/03/2018   Lab Results  Component Value Date   NA 144 09/09/2020   K 4.3 09/09/2020   CL 104 09/09/2020   CO2 23 09/09/2020    RADIOGRAPHIC STUDIES: I have personally reviewed the radiological reports and agreed with the findings in the report.  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 New lower back pain and headaches necessitate staging imaging to exclude metastasis. Neoadjuvant chemotherapy and immunotherapy are standard due to nodal involvement and triple negative status. - Presented case for multidisciplinary tumor board review. - Ordered PET scan for staging to assess for metastatic disease. - Scheduled port placement for chemotherapy administration, contingent on PET scan results. - Prepared for neoadjuvant chemotherapy and immunotherapy with PLANS to consider KEYNOTE 522 vs NEOPACT regimen - Scheduled chemotherapy education class prior to therapy initiation. - ECHO to be scheduled for chemo administration, will prefer waiting for PET scan. - Coordinate expedited scheduling for PET scan and port placement. - Plan to follow up after PET scan  and port placement to  finalize treatment plan.   All questions were answered. The patient knows to call the clinic with any problems, questions or concerns.    Amber Stalls, MD 08/13/24

## 2024-08-14 ENCOUNTER — Encounter (HOSPITAL_COMMUNITY)
Admission: RE | Admit: 2024-08-14 | Discharge: 2024-08-14 | Disposition: A | Source: Ambulatory Visit | Attending: Hematology and Oncology

## 2024-08-14 ENCOUNTER — Encounter: Payer: Self-pay | Admitting: *Deleted

## 2024-08-14 DIAGNOSIS — Z171 Estrogen receptor negative status [ER-]: Secondary | ICD-10-CM | POA: Insufficient documentation

## 2024-08-14 DIAGNOSIS — C50212 Malignant neoplasm of upper-inner quadrant of left female breast: Secondary | ICD-10-CM | POA: Diagnosis present

## 2024-08-14 LAB — GLUCOSE, CAPILLARY: Glucose-Capillary: 105 mg/dL — ABNORMAL HIGH (ref 70–99)

## 2024-08-14 MED ORDER — FLUDEOXYGLUCOSE F - 18 (FDG) INJECTION
6.3200 | Freq: Once | INTRAVENOUS | Status: AC | PRN
  Administered 2024-08-14: 6.32 via INTRAVENOUS

## 2024-08-14 NOTE — Progress Notes (Addendum)
 Navigator met patient, husband, and son [name] at initial appt with Dr. Loretha. Introduced role of navigation and gave her my contact information. Journey notebook and Breast Cancer book given to her.  PET to be scheduled first. Contacted [facility] to schedule. No preauth needed per Wetzel Memos.  Patient on for tumor board conference next week 1/28.  PET scheduled for today 1/23 at [facility]. Sent message to Dr. Curvin to have port placed as soon as possible. Chemo class to be scheduled. Request given to [facility] scheduler.  ECHO can hold off on for now per Dr. Loretha.  Will schedule patient to come back to see Dr. Loretha as well ideally next week with chemo class.

## 2024-08-15 ENCOUNTER — Telehealth: Payer: Self-pay | Admitting: Hematology and Oncology

## 2024-08-15 NOTE — Telephone Encounter (Signed)
 I spoke with patient and she is aware of MD and chemo education appointments on 08/20/2024.

## 2024-08-18 ENCOUNTER — Ambulatory Visit: Payer: Self-pay | Admitting: General Surgery

## 2024-08-18 NOTE — Progress Notes (Signed)
 "   REFERRING PHYSICIAN:  [facility] * PROVIDER:  DEWARD GARNETTE NULL, MD MRN: [id number] DOB: [birth year] DATE OF ENCOUNTER: 08/18/2024 Subjective    Chief Complaint: New Consultation (triple negative breast cancer with + lymph nodes,)   History of Present Illness: [name] is a 79 y.o. female who is seen today as an office consultation for evaluation of New Consultation (triple negative breast cancer with + lymph nodes,)   We are asked to see the patient in consultation by Dr. Lauraine Mirza to evaluate her for a new left breast cancer.  The patient is a 78 year old white female who recently went for a routine screening mammogram.  At that time she was found to have a 1.5 cm mass in the upper inner quadrant of the left breast with multiple abnormal lymph nodes.  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  She is otherwise in good health and does not smoke.    Review of Systems: A complete review of systems was obtained from the patient.  I have reviewed this information and discussed as appropriate with the patient.  See HPI as well for other ROS.  ROS   Medical History: Past Medical History:  Diagnosis Date   Anxiety    History of cancer    Hyperlipidemia    Hypertension    Sleep apnea     Patient Active Problem List  Diagnosis   Malignant neoplasm of upper-inner quadrant of left breast in female, estrogen receptor negative (CMS/HHS-HCC)    Past Surgical History:  Procedure Laterality Date   COLON SURGERY     HYSTERECTOMY       Allergies  Allergen Reactions   Penicillin Hives    No current outpatient medications on file prior to visit.   No current facility-administered medications on file prior to visit.    Family History  Problem Relation Age of Onset   Breast cancer Mother    High blood pressure (Hypertension) Father    Hyperlipidemia (Elevated  cholesterol) Father      Social History   Tobacco Use  Smoking Status Never  Smokeless Tobacco Not on file     Social History   Socioeconomic History   Marital status: Married  Tobacco Use   Smoking status: Never  Vaping Use   Vaping status: Unknown  Substance and Sexual Activity   Alcohol use: No   Drug use: No   Social Drivers of Corporate Investment Banker Strain: Low Risk (03/27/2024)   Received from [facility]   Overall Financial Resource Strain (CARDIA)    How hard is it for you to pay for the very basics like food, housing, medical care, and heating?: Not hard at all  Food Insecurity: No Food Insecurity (08/13/2024)   Received from [facility]   Hunger Vital Sign    Within the past 12 months, you worried that your food would run out before you got the money to buy more.: Never true    Within the past 12 months, the food you bought just didn't last and you didn't have money to get more.: Never true  Transportation Needs: No Transportation Needs (08/13/2024)   Received from [facility] - Transportation    In the past 12 months, has lack of transportation kept you from medical appointments or from getting medications?: No    In the past 12 months, has lack  of transportation kept you from meetings, work, or from getting things needed for daily living?: No  Physical Activity: Insufficiently Active (03/27/2024)   Received from [facility]   Exercise Vital Sign    On average, how many days per week do you engage in moderate to strenuous exercise (like a brisk walk)?: 2 days    On average, how many minutes do you engage in exercise at this level?: 60 min  Stress: No Stress Concern Present (03/27/2024)   Received from [facility]ational [facility] - Occupational Stress Questionnaire    Do you feel stress - tense, restless, nervous, or anxious, or unable to sleep at night because your mind is troubled all the time - these  days?: Not at all  Social Connections: Socially Integrated (03/27/2024)   Received from [facility]   Social Network    How would you rate your social network (family, work, friends)?: Good participation with social networks  Housing Stability: Unknown (08/18/2024)   Housing Stability Vital Sign    Homeless in the Last Year: No    Objective:   Vitals:   08/18/24 1132  BP: (!) 155/78  Pulse: 98  Resp: 16  Temp: 36.6 C (97.9 F)  SpO2: 98%  Weight: 58.9 kg (129 lb 12.8 oz)  Height: 165.1 cm (5' 5)  PainSc:   3    Body mass index is 21.6 kg/m.  Physical Exam Vitals reviewed.  Constitutional:      General: She is not in acute distress.    Appearance: Normal appearance.  HENT:     Head: Normocephalic and atraumatic.     Right Ear: External ear normal.     Left Ear: External ear normal.     Nose: Nose normal.     Mouth/Throat:     Mouth: Mucous membranes are moist.     Pharynx: Oropharynx is clear.  Eyes:     General: No scleral icterus.    Extraocular Movements: Extraocular movements intact.     Conjunctiva/sclera: Conjunctivae normal.     Pupils: Pupils are equal, round, and reactive to light.  Cardiovascular:     Rate and Rhythm: Normal rate and regular rhythm.     Pulses: Normal pulses.     Heart sounds: Normal heart sounds.  Pulmonary:     Effort: Pulmonary effort is normal. No respiratory distress.     Breath sounds: Normal breath sounds.  Abdominal:     General: Bowel sounds are normal.     Palpations: Abdomen is soft.     Tenderness: There is no abdominal tenderness.  Musculoskeletal:        General: No swelling, tenderness or deformity. Normal range of motion.     Cervical back: Normal range of motion and neck supple.  Skin:    General: Skin is warm and dry.     Coloration: Skin is not jaundiced.  Neurological:     General: No focal deficit present.     Mental Status: She is alert and oriented to person, place, and time.  Psychiatric:        Mood  and Affect: Mood normal.        Behavior: Behavior normal.      Breast: There is no palpable mass in either breast.  There are 3-4 enlarged palpable mobile lymph nodes in the left axilla.  Labs, Imaging and Diagnostic Testing:   Assessment and Plan:     Diagnoses and all orders for this visit:  Malignant neoplasm of upper-inner quadrant of left breast in female, estrogen receptor negative (CMS/HHS-HCC) -     CCS Case Posting Request; Future    The patient appears to have a 1.5 cm triple negative breast cancer in the upper inner quadrant of the left breast with 5-6 positive lymph nodes as well as a positive subpectoral lymph node and 1 small level 2 lymph node.  I have discussed with her in detail the different options for treatment and at this point I think she would benefit from neoadjuvant chemotherapy.  We will plan to place a Port-A-Cath next week so she can begin as soon as possible.  I have discussed with her in detail the risks and benefits of the operation as well as some of the technical aspects including the risk of pneumothorax and she understands and wishes to proceed.    DEWARD GARNETTE NULL, MD    I spent a total of 60 minutes in both face-to-face and non-face-to-face activities, excluding procedures performed, for this visit on the date of this encounter.     "

## 2024-08-19 ENCOUNTER — Encounter: Payer: Self-pay | Admitting: *Deleted

## 2024-08-19 ENCOUNTER — Other Ambulatory Visit: Payer: Self-pay | Admitting: Hematology and Oncology

## 2024-08-19 DIAGNOSIS — Z171 Estrogen receptor negative status [ER-]: Secondary | ICD-10-CM

## 2024-08-19 DIAGNOSIS — Z5181 Encounter for therapeutic drug level monitoring: Secondary | ICD-10-CM

## 2024-08-19 NOTE — Progress Notes (Signed)
 START ON PATHWAY REGIMEN - Breast     Cycles 1 through 4: A cycle is every 21 days:     Pembrolizumab      Paclitaxel      Carboplatin      Filgrastim-xxxx    Cycles 5 through 8: A cycle is every 21 days:     Pembrolizumab      Doxorubicin      Cyclophosphamide      Pegfilgrastim-xxxx   **Always confirm dose/schedule in your pharmacy ordering system**  Patient Characteristics: Preoperative or Nonsurgical Candidate, M0 (Clinical Staging), Up to cT4c, Any N, M0, Neoadjuvant Therapy followed by Surgery, Invasive Disease, Chemotherapy, HER2 Negative, ER Negative, Platinum Therapy Indicated and Candidate for Checkpoint Inhibitor Therapeutic Status: Preoperative or Nonsurgical Candidate, M0 (Clinical Staging) AJCC T Category: cT1b AJCC N Category: cN1 AJCC M Category: cM0 AJCC Grade: G3 ER Status: Negative (-) PR Status: Negative (-) HER2 Status: Negative (-) AJCC 8 Stage Grouping: IIB Breast Surgical Plan: Neoadjuvant Therapy followed by Surgery Intent of Therapy: Curative Intent, Discussed with Patient

## 2024-08-19 NOTE — Progress Notes (Signed)
 Called patient back and updated her on her overall plan. She will be here tomorrow am to meet with Dr. Loretha and have chemo class. Dr. Curvin will put in port on [facility] Feb 5 with chemo anticipated to start the next day. MRI scheduled. ECHO scheduled. She was appreciative of phone call.

## 2024-08-20 ENCOUNTER — Other Ambulatory Visit (HOSPITAL_BASED_OUTPATIENT_CLINIC_OR_DEPARTMENT_OTHER)

## 2024-08-20 ENCOUNTER — Inpatient Hospital Stay: Admitting: Pharmacist

## 2024-08-20 ENCOUNTER — Inpatient Hospital Stay

## 2024-08-20 ENCOUNTER — Other Ambulatory Visit: Payer: Self-pay

## 2024-08-20 ENCOUNTER — Inpatient Hospital Stay (HOSPITAL_BASED_OUTPATIENT_CLINIC_OR_DEPARTMENT_OTHER): Admitting: Hematology and Oncology

## 2024-08-20 ENCOUNTER — Encounter: Payer: Self-pay | Admitting: Hematology and Oncology

## 2024-08-20 VITALS — BP 140/68 | HR 74 | Temp 97.2°F | Resp 17 | Wt 133.4 lb

## 2024-08-20 DIAGNOSIS — C50212 Malignant neoplasm of upper-inner quadrant of left female breast: Secondary | ICD-10-CM | POA: Diagnosis not present

## 2024-08-20 DIAGNOSIS — Z5181 Encounter for therapeutic drug level monitoring: Secondary | ICD-10-CM

## 2024-08-20 DIAGNOSIS — Z171 Estrogen receptor negative status [ER-]: Secondary | ICD-10-CM

## 2024-08-20 DIAGNOSIS — Z0189 Encounter for other specified special examinations: Secondary | ICD-10-CM | POA: Diagnosis not present

## 2024-08-20 DIAGNOSIS — Z79899 Other long term (current) drug therapy: Secondary | ICD-10-CM | POA: Diagnosis not present

## 2024-08-20 LAB — ECHOCARDIOGRAM COMPLETE
AR max vel: 2.59 cm2
AV Area VTI: 2.73 cm2
AV Area mean vel: 2.54 cm2
AV Mean grad: 3 mmHg
AV Peak grad: 5.4 mmHg
Ao pk vel: 1.16 m/s
Area-P 1/2: 2.62 cm2
S' Lateral: 1.94 cm
Weight: 2134.4 [oz_av]

## 2024-08-20 MED ORDER — LIDOCAINE-PRILOCAINE 2.5-2.5 % EX CREA: TOPICAL_CREAM | CUTANEOUS | 3 refills | Status: AC

## 2024-08-20 MED ORDER — ONDANSETRON HCL 8 MG PO TABS: 8.0000 mg | ORAL_TABLET | Freq: Three times a day (TID) | ORAL | 1 refills | Status: AC | PRN

## 2024-08-20 MED ORDER — DEXAMETHASONE 4 MG PO TABS: ORAL_TABLET | ORAL | 1 refills | Status: AC

## 2024-08-20 MED ORDER — PROCHLORPERAZINE MALEATE 10 MG PO TABS: 10.0000 mg | ORAL_TABLET | Freq: Four times a day (QID) | ORAL | 1 refills | Status: AC | PRN

## 2024-08-20 NOTE — Progress Notes (Signed)
 [facility] CONSULT NOTE  Patient Care Team: [name] [name] as PCP - General (Physician Assistant) Anna Candyce RAMAN, MD as PCP - Cardiology (Cardiology) Anna Devere HERO, RN as Oncology Nurse Navigator  CHIEF COMPLAINTS/PURPOSE OF CONSULTATION:  Newly diagnosed breast cancer  HISTORY OF PRESENTING ILLNESS:  [name] 79 y.o. female is here because of recent diagnosis of left breast IDC  I reviewed her records extensively and collaborated the history with the patient.  SUMMARY OF ONCOLOGIC HISTORY: Oncology History  Malignant neoplasm of upper-inner quadrant of left breast in female, estrogen receptor negative (HCC)  08/13/2024 Initial Diagnosis   Malignant neoplasm of upper-inner quadrant of left breast in female, estrogen receptor negative (HCC)   08/13/2024 Cancer Staging   Staging form: Breast, AJCC 8th Edition - Clinical: Stage IIB (cT1b, cN1, cM0, G3, ER-, PR-, HER2-) - Signed by Anna Ash, MD on 08/13/2024 Histologic grading system: 3 grade system   08/28/2024 -  Chemotherapy   Patient is on Treatment Plan : BREAST Pembrolizumab (200) D1 + Carboplatin (1.5) D1,8,15 + Paclitaxel (80) D1,8,15 q21d X 4 cycles / Pembrolizumab (200) D1 + AC D1 q21d x 4 cycles      Discussed the use of AI scribe software for clinical note transcription with the patient, who gave verbal consent to proceed.  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  Aug 19, 2024: Initial oncology visit for HER2-negative, ER-negative, PR-negative stage IIB breast cancer; discussed and initiated neoadjuvant chemotherapy regimen with pembrolizumab, paclitaxel, carboplatin, and filgrastim (cycles 1-4), followed by pembrolizumab, doxorubicin,  cyclophosphamide, and pegfilgrastim (cycles 5-8), with curative intent. Patient is a candidate for platinum therapy and checkpoint inhibitors; surgical plan is neoadjuvant therapy followed by surgery.  She has a small primary left breast tumor with extensive regional lymphadenopathy, including bulky level one and level two axillary nodes as well as subpectoral involvement. Disease burden is predominantly nodal. Recent PET imaging showed no evidence of distant metastatic disease. MRI is scheduled next week to further assess disease extent.  She remains physically active, riding five miles in thirty minutes on good days.  She inquired about dietary restrictions during chemotherapy, specifically regarding raw meats, cheeses, and protein supplementation.   Rest of the pertinent 10 point ROS reviewed and neg.  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   CT CTA CORONARY W/CA SCORE W/CM &/OR WO/CM  06/18/2018   no further testing    NASAL SEPTUM SURGERY     NASOPHARYNGEAL STENOSIS REPAIR     tohelp with snoring    SOCIAL HISTORY: Social History   Socioeconomic History   Marital status: Married    Spouse name: Not  on file   Number of children: Not on file   Years of education: Not on file   Highest education level: Not on file  Occupational History   Not on file  Tobacco Use   Smoking status: Never   Smokeless tobacco: Never  Vaping Use   Vaping status: Never Used  Substance and Sexual Activity   Alcohol use: Not  Currently    Comment: quit 5 years ago   Drug use: Never   Sexual activity: Not on file  Other Topics Concern   Not on file  Social History Narrative   Not on file   Social Drivers of Health   Tobacco Use: Unknown (08/18/2024)   Received from [facility]   Patient History    Smoking Tobacco Use: Never    Smokeless Tobacco Use: Unknown    Passive Exposure: Not on file  Financial Resource Strain: Low Risk (03/27/2024)   Received from [facility]   Overall Financial Resource Strain (CARDIA)    How hard is it for you to pay for the very basics like food, housing, medical care, and heating?: Not hard at all  Food Insecurity: No Food Insecurity (08/13/2024)   Epic    Worried About Running Out of Food in the Last Year: Never true    Ran Out of Food in the Last Year: Never true  Transportation Needs: No Transportation Needs (08/13/2024)   Epic    Lack of Transportation (Medical): No    Lack of Transportation (Non-Medical): No  Physical Activity: Insufficiently Active (03/27/2024)   Received from [facility]   Exercise Vital Sign    On average, how many days per week do you engage in moderate to strenuous exercise (like a brisk walk)?: 2 days    On average, how many minutes do you engage in exercise at this level?: 60 min  Stress: No Stress Concern Present (03/27/2024)   Received from [facility] - Occupational Stress Questionnaire    Do you feel stress - tense, restless, nervous, or anxious, or unable to sleep at night because your mind is troubled all the time - these days?: Not at all  Social Connections: Socially Integrated (03/27/2024)   Received from [facility]   Social Network    How would you rate your social network (family, work, friends)?: Good participation with social networks  Intimate Partner Violence: Not At Risk (03/27/2024)   Received from [facility]   HITS    Over the last 12 months how often did your  partner physically hurt you?: Never    Over the last 12 months how often did your partner insult you or talk down to you?: Sometimes    Over the last 12 months how often did your partner threaten you with physical harm?: Never    Over the last 12 months how often did your partner scream or curse at you?: Never  Depression (PHQ2-9): Not on file  Alcohol Screen: Not on file  Housing: Unknown (08/18/2024)   Received from [facility]   Epic    Unable to Pay for Housing in the Last Year: Not on file    Number of Times Moved in the Last Year: Not on file    At any time in the past 12 months, were you homeless or living in a shelter (including now)?: No  Utilities: Not At Risk (08/13/2024)   Epic    Threatened with loss of  utilities: No  Health Literacy: Not on file    FAMILY HISTORY: Family History  Problem Relation Age of Onset   Liver disease Mother    Alcohol abuse Mother    Heart attack Father    Breast cancer Sister    Colon cancer Neg Hx    Colon polyps Neg Hx    Esophageal cancer Neg Hx    Rectal cancer Neg Hx    Stomach cancer Neg Hx     ALLERGIES:  is allergic to penicillins.  MEDICATIONS:  Current Outpatient Medications  Medication Sig Dispense Refill   alendronate (FOSAMAX) 70 MG tablet Take 70 mg by mouth every 7 (seven) days.     B Complex-C (SUPER B COMPLEX PO) Take 1 tablet by mouth daily.     Cholecalciferol (VITAMIN D3) 50 MCG (2000 UT) TABS Take 2,000 Units by mouth daily.     CO-ENZYME Q-10 PO Take 300 mg by mouth daily.     cycloSPORINE (RESTASIS) 0.05 % ophthalmic emulsion Place 1 drop into both eyes 2 (two) times daily.     fluticasone (FLONASE) 50 MCG/ACT nasal spray Place 2 sprays into both nostrils daily.     Krill Oil 500 MG CAPS Take 500 mg by mouth daily.     MAGNESIUM CITRATE PO Take 250 mg by mouth daily.     Menaquinone-7 (K2 PO) Take 20 mcg by mouth daily.     Multiple Vitamin (MULTIVITAMIN) tablet Take 1 tablet by mouth daily.      Probiotic Product (PROBIOTIC PO) Take 1 capsule by mouth daily.     rosuvastatin  (CRESTOR ) 20 MG tablet Take 20 mg by mouth at bedtime.      zinc gluconate 50 MG tablet Take 50 mg by mouth daily.     No current facility-administered medications for this visit.   All other systems were reviewed with the patient and are negative.  PHYSICAL EXAMINATION: ECOG PERFORMANCE STATUS: 0 - Asymptomatic  Vitals:   08/20/24 0845  BP: (!) 140/68  Pulse: 74  Resp: 17  Temp: (!) 97.2 F (36.2 C)  SpO2: 98%   Filed Weights   08/20/24 0845  Weight: 133 lb 6.4 oz (60.5 kg)    GENERAL:alert, no distress and comfortable   LABORATORY DATA:  I have reviewed the data as listed Lab Results  Component Value Date   WBC 6.5 02/03/2018   HGB 12.6 02/03/2018   HCT 39.0 02/03/2018   MCV 92.0 02/03/2018   PLT 251 02/03/2018   Lab Results  Component Value Date   NA 144 09/09/2020   K 4.3 09/09/2020   CL 104 09/09/2020   CO2 23 09/09/2020    RADIOGRAPHIC STUDIES: I have personally reviewed the radiological reports and agreed with the findings in the report.  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 New lower back pain and headaches necessitate staging imaging to exclude metastasis. Neoadjuvant chemotherapy and immunotherapy are standard due to nodal involvement and triple negative status. - Presented case for multidisciplinary tumor board review. - PET with no evidence of distant met disease. Hypermetabolic metastatic left subpectoral and left axillary adenopathy.  Borderline hypermetabolic tiny left level II lymph node. - Recommend attention on follow-up. - MRI neoadjuvant planned for 08/26/2024. - Port placement scheduled for 08/27/2024 -Anticipate chemo on 08/28/2024 - She has  chemo class scheduled  for today - Will see her back before C1D8  All questions were answered. The patient knows to call the clinic with any problems, questions or concerns.    Amber Stalls, MD 08/20/24

## 2024-08-20 NOTE — Progress Notes (Signed)
 " [name]        Telephone: [phone]?Fax: [phone]   Oncology Clinical Pharmacist Practitioner Initial Assessment   [name] is a 79 y.o. female with a diagnosis of breast cancer. They were contacted today via in-person visit. She is accompanied by her husband [name].  Indication/Regimen Pembrolizumab (Keytruda), paclitaxel (Taxol), carboplatin (Paraplatin ) followed by pembrolizumab Sigmund), doxorubicin (Adriamycin), cyclophosphamide (Cytoxan) is being used appropriately for treatment of breast cancer by Dr. Amber Stalls.      Wt Readings from Last 1 Encounters:  08/20/24 133 lb 6.4 oz (60.5 kg)    Estimated body surface area is 1.68 meters squared as calculated from the following:   Height as of 08/13/24: 5' 6 (1.676 m).   Weight as of an earlier encounter on 08/20/24: 133 lb 6.4 oz (60.5 kg).  The dosing regimen cycle is every 21 days x 4 cycles  Pembrolizumab (200 mg) on Day 1 Paclitaxel (80 mg/m2) on Days 1, 8, 15 Carboplatin (AUC 1.5) on Day 1, 8, 15 Filgrastim (300 mcg or 480 mcg based on weight)  on Days 16, 17, 18  Followed by the below regimen cycle every 21 days x 4 cycles  Pembrolizumab (200 mg) Day 1 Doxorubicin (60 mg/m2) Day 1 Cyclophosphamide (600 mg/m2) Day 1 Pegfilgrastim (6 mg) on Day 3  It is planned to continue until treatment plan completion or unacceptable toxicity. The tentative start date is: 08/28/24  Dose Modifications None   Allergies Allergies[1]  Vitals    08/20/2024    8:45 AM 08/13/2024    3:25 PM 07/31/2023   10:37 AM  Oncology Vitals  Height  168 cm   Weight 60.51 kg 57.743 kg   Weight (lbs) 133 lbs 6 oz 127 lbs 5 oz   BMI 21.53 kg/m2 20.55 kg/m2   Temp 97.2 F (36.2 C) 98.9 F (37.2 C)   Pulse Rate 74 82 69  BP 140/68 132/60 119/61  Resp 17 17 15   SpO2 98 % 98 % 98 %  BSA (m2) 1.68 m2 1.64 m2      Laboratory Data    Latest Ref Rng & Units 02/03/2018    6:21 PM  CBC EXTENDED   WBC 4.0 - 10.5 K/uL 6.5   RBC 3.87 - 5.11 MIL/uL 4.24   Hemoglobin 12.0 - 15.0 g/dL 87.3   HCT 63.9 - 53.9 % 39.0   Platelets 150 - 400 K/uL 251        Latest Ref Rng & Units 09/09/2020    4:28 PM 06/18/2018    8:32 AM 02/04/2018    9:49 AM  CMP  Glucose 65 - 99 mg/dL 93  93    BUN 8 - 27 mg/dL 13  15    Creatinine 9.42 - 1.00 mg/dL 9.15  9.12    Sodium 865 - 144 mmol/L 144  143    Potassium 3.5 - 5.2 mmol/L 4.3  4.1    Chloride 96 - 106 mmol/L 104  103    CO2 20 - 29 mmol/L 23  23    Calcium  8.7 - 10.3 mg/dL 89.9  9.7    Total Protein 6.5 - 8.1 g/dL   7.1   Total Bilirubin 0.3 - 1.2 mg/dL   0.9   Alkaline Phos 38 - 126 U/L   56   AST 15 - 41 U/L   22   ALT 0 - 44 U/L   16     Contraindications Contraindications were  reviewed? Yes Contraindications to therapy were identified? No   Safety Precautions The following safety precautions were reviewed:  Fever: reviewed the importance of having a thermometer and the Centers for Disease Control and Prevention (CDC) definition of fever which is 100.46F (38C) or higher. Patient should call 24/7 triage at [phone] if experiencing a fever or any other symptoms Decreased white blood cells (WBCs) and increased risk for infection Decreased platelet count and increased risk of bleeding Decreased hemoglobin, part of the red blood cells that carry iron and oxygen Nausea or vomiting Diarrhea or constipation Hair Loss Fatigue Changes in liver function Rash Peripheral Neuropathy Hypersensitivity reactions Pembrolizumab toxicities (skin, lung, liver, thyroid, kidneys, vision) Changes in color of urine Mouth sores MDS/AML Grapefruit products may interact with paclitaxel. Avoid use Handling body fluids and waste Intimacy, sexual activity, contraception, fertility  Medication Reconciliation Current Outpatient Medications  Medication Sig Dispense Refill   alendronate (FOSAMAX) 70 MG tablet Take 70 mg by mouth every 7 (seven)  days.     B Complex-C (SUPER B COMPLEX PO) Take 1 tablet by mouth daily.     Cholecalciferol (VITAMIN D3) 50 MCG (2000 UT) TABS Take 2,000 Units by mouth daily.     CO-ENZYME Q-10 PO Take 300 mg by mouth daily.     cycloSPORINE (RESTASIS) 0.05 % ophthalmic emulsion Place 1 drop into both eyes 2 (two) times daily.     fluticasone (FLONASE) 50 MCG/ACT nasal spray Place 2 sprays into both nostrils daily.     Krill Oil 500 MG CAPS Take 500 mg by mouth daily.     MAGNESIUM CITRATE PO Take 250 mg by mouth daily.     Menaquinone-7 (K2 PO) Take 20 mcg by mouth daily.     Multiple Vitamin (MULTIVITAMIN) tablet Take 1 tablet by mouth daily.     Probiotic Product (PROBIOTIC PO) Take 1 capsule by mouth daily.     rosuvastatin  (CRESTOR ) 20 MG tablet Take 20 mg by mouth at bedtime.      zinc gluconate 50 MG tablet Take 50 mg by mouth daily.     dexamethasone  (DECADRON ) 4 MG tablet Take 2 tablets daily for 2 days, start the day after chemotherapy. Take with food. 30 tablet 1   lidocaine -prilocaine  (EMLA ) cream Apply to affected area once 30 g 3   ondansetron  (ZOFRAN ) 8 MG tablet Take 1 tablet (8 mg total) by mouth every 8 (eight) hours as needed for nausea or vomiting. Start on the third day after chemotherapy. 30 tablet 1   prochlorperazine  (COMPAZINE ) 10 MG tablet Take 1 tablet (10 mg total) by mouth every 6 (six) hours as needed for nausea or vomiting. 30 tablet 1   No current facility-administered medications for this visit.    Medication reconciliation is based on the patient's most recent medication list in the electronic medical record (EMR) including herbal products and OTC medications.   The patient's medication list was reviewed today with the patient? Yes   Drug-drug interactions (DDIs) DDIs were evaluated? Yes Significant DDIs identified? We reviewed that she may want to consider holding antioxidants while on chemotherapy. Link to ACS article provided below  Drug-Food  Interactions Drug-food interactions were evaluated? Yes Drug-food interactions identified? Avoid grapefruit products  Follow-up Plan  Treatment start date: 08/28/24 [facility] placement date: 08/27/24 ECHO date: Sent nurse navigators and Dr. Loretha about scheduling ECHO prior to doxorubicin We reviewed the prescriptions, premedications, and treatment regimen with the patient. Possible side effects of the treatment regimen were reviewed  and management strategies were discussed.  Can use over-the-counter (OTC) options of loperamide (Imodium) as needed for diarrhea, loratadine (Claritin) as needed for G-CSF bone pain, and docusate + senna (Senna-S) as needed for constipation.  American Cancer Society antioxidant article link: https://www.cancer.org/cancer/latest-news/study-finds-antioxidants-risky-during-breast-cancer-chemotherapy.html  Clinical pharmacy will assist Dr. Praveena Iruku and Barnie JINNY Parsley on an as needed basis going forward  WANZA SZUMSKI participated in the discussion, expressed understanding, and voiced agreement with the above plan. All questions were answered to their satisfaction. The patient was advised to contact the clinic at [phone] with any questions or concerns prior to their return visit.   I spent 60 minutes assessing the patient.  Jemery Stacey A. Lucila, PharmD, BCOP, CPP  Norleen DELENA Lucila, RPH-CPP, 08/20/2024 10:03 AM  **Disclaimer: This note was dictated with voice recognition software. Similar sounding words can inadvertently be transcribed and this note may contain transcription errors which may not have been corrected upon publication of note.**     [1]  Allergies Allergen Reactions   Penicillins Hives    Has patient had a PCN reaction causing immediate rash, facial/tongue/throat swelling, SOB or lightheadedness with hypotension: Yes Has patient had a PCN reaction causing severe rash involving mucus membranes or skin necrosis: No Has patient had a PCN reaction  that required hospitalization: Yes Has patient had a PCN reaction occurring within the last 10 years: No If all of the above answers are NO, then may proceed with Cephalosporin use.    "

## 2024-08-20 NOTE — Progress Notes (Signed)
 Returned call to patient's spouse after receiving my card per my request.  Introduced myself as Dance Movement Psychotherapist and to ask if they had any financial questions or concerns regarding treatment. Pt has two insurances and therefore no questions or concerns at this time per spouse. Advised to contact me should any concerns arise. He verbalized understanding.  They have my card to do so and for any other questions or concerns.

## 2024-08-21 ENCOUNTER — Encounter: Payer: Self-pay | Admitting: Pharmacist

## 2024-08-21 ENCOUNTER — Encounter: Payer: Self-pay | Admitting: *Deleted

## 2024-08-21 DIAGNOSIS — C50212 Malignant neoplasm of upper-inner quadrant of left female breast: Secondary | ICD-10-CM

## 2024-08-21 NOTE — Progress Notes (Addendum)
 Pharmacist Chemotherapy Monitoring - Initial Assessment    Anticipated start date: 08/28/2024   The following has been reviewed per standard work regarding the patient's treatment regimen: The patient's diagnosis, treatment plan and drug doses, and organ/hematologic function Lab orders and baseline tests specific to treatment regimen  The treatment plan start date, drug sequencing, and pre-medications Prior authorization status  Patient's documented medication list, including drug-drug interaction screen and prescriptions for anti-emetics and supportive care specific to the treatment regimen The drug concentrations, fluid compatibility, administration routes, and timing of the medications to be used The patient's access for treatment and lifetime cumulative dose history, if applicable  The patient's medication allergies and previous infusion related reactions, if applicable   Changes made to treatment plan:  N/A  Follow up needed:  Pending authorization for treatment  Referral #[id number] Pending review as of 08/21/2024 Port place: Tentatively 08/27/2024 (no appt yet) ECHO: no appt currently, per note on 1/29 pharmacist sent nurse navigators and Dr. Loretha a note to schedule prior to starting doxorubicin   Thank you for allowing pharmacy to participate in this patient's care.   Alfonso MARLA Buys, PharmD Pharmacy Resident  08/21/2024 10:46 AM

## 2024-08-21 NOTE — Progress Notes (Signed)
 Patient called navigator with questions about nutrition during chemo. She would like to talk to dietician. Referral placed. She states she will need a script faxed when it is time for her to get a wig. I informed patient that we will fax it when she is ready. Patient states she feels overwhelmed from all the information yesterday. Did have ECHO as well yesterday. MRI scheduled for next week prior to port placement/chemo. Patient was appreciative of conversation and knows to call for any other concerns.

## 2024-08-23 ENCOUNTER — Encounter: Payer: Self-pay | Admitting: *Deleted

## 2024-08-24 ENCOUNTER — Inpatient Hospital Stay: Admitting: Hematology and Oncology

## 2024-08-25 NOTE — Pre-Procedure Instructions (Signed)
 Surgical Instructions   Your procedure is scheduled on August 27, 2024. Report to [facility] Main Entrance A at 815 A.M., then check in with the Admitting office. Any questions or running late day of surgery: call [phone]  Questions prior to your surgery date: call [phone], Monday-Friday, 8am-4pm. If you experience any cold or flu symptoms such as cough, fever, chills, shortness of breath, etc. between now and your scheduled surgery, please notify us  at the above number.     Remember:  Do not eat after midnight the night before your surgery.   You may drink clear liquids until 715AM the morning of your surgery.   Clear liquids allowed are: Water, Non-Citrus Juices (without pulp), Carbonated Beverages, Clear Tea (no milk, honey, etc.), Black Coffee Only (NO MILK, CREAM OR POWDERED CREAMER of any kind), and Gatorade.    Take these medicines the morning of surgery with A SIP OF WATER: cycloSPORINE (RESTASIS) 0.05 % ophthalmic emulsion  fluticasone (FLONASE) 50 MCG/ACT nasal spray   May take these medicines IF NEEDED: ondansetron  (ZOFRAN ) prochlorperazine  (COMPAZINE )   One week prior to surgery, STOP taking any Aspirin  (unless otherwise instructed by your surgeon) Aleve, Naproxen, Ibuprofen, Motrin, Advil, Goody's, BC's, all herbal medications, fish oil, and non-prescription vitamins.                     Do NOT Smoke (Tobacco/Vaping) for 24 hours prior to your procedure.  If you use a CPAP at night, you may bring your mask/headgear for your overnight stay.   You will be asked to remove any contacts, glasses, piercing's, hearing aid's, dentures/partials prior to surgery. Please bring cases for these items if needed.    Your surgeon will determine if you are to be admitted or discharged the same day.  Patients discharged the day of surgery will not be allowed to drive home, and someone needs to stay with them for 24 hours.  SURGICAL WAITING ROOM VISITATION Patients may  have no more than 2 support people in the waiting area - these visitors may rotate.   Pre-op nurse will coordinate an appropriate time for 2 ADULT support persons, who may not rotate, to accompany patient in pre-op.  Children under the age of 74 must have an adult with them who is not the patient and must remain in the main waiting area with an adult.  If the patient needs to stay at the hospital during part of their recovery, the visitor guidelines for inpatient rooms apply.  Please refer to the [facility] website for the visitor guidelines for any additional information.   If you received a COVID test during your pre-op visit  it is requested that you wear a mask when out in public, stay away from anyone that may not be feeling well and notify your surgeon if you develop symptoms. If you have been in contact with anyone that has tested positive in the last 10 days please notify you surgeon.      Pre-operative CHG Bathing Instructions   You can play a key role in reducing the risk of infection after surgery. Your skin needs to be as free of germs as possible. You can reduce the number of germs on your skin by washing with CHG (chlorhexidine  gluconate) soap before surgery. CHG is an antiseptic soap that kills germs and continues to kill germs even after washing.   DO NOT use if you have an allergy to chlorhexidine /CHG or antibacterial soaps. If your skin becomes reddened  or irritated, stop using the CHG and notify one of our RNs at [phone].              TAKE A SHOWER THE NIGHT BEFORE SURGERY   Please keep in mind the following:  DO NOT shave, including legs and underarms, 48 hours prior to surgery.   You may shave your face before/day of surgery.  Place clean sheets on your bed the night before surgery Use a clean washcloth (not used since being washed) for shower. DO NOT sleep with pet's night before surgery.  CHG Shower Instructions:  Wash your face and private area with normal  soap. If you choose to wash your hair, wash first with your normal shampoo.  After you use shampoo/soap, rinse your hair and body thoroughly to remove shampoo/soap residue.  Turn the water OFF and apply half the bottle of CHG soap to a CLEAN washcloth.  Apply CHG soap ONLY FROM YOUR NECK DOWN TO YOUR TOES (washing for 3-5 minutes)  DO NOT use CHG soap on face, private areas, open wounds, or sores.  Pay special attention to the area where your surgery is being performed.  If you are having back surgery, having someone wash your back for you may be helpful. Wait 2 minutes after CHG soap is applied, then you may rinse off the CHG soap.  Pat dry with a clean towel  Put on clean pajamas    Additional instructions for the day of surgery: If you choose, you may shower the morning of surgery with an antibacterial soap.  DO NOT APPLY any lotions, deodorants, cologne, or perfumes.   Do not wear jewelry or makeup Do not wear nail polish, gel polish, artificial nails, or any other type of covering on natural nails (fingers and toes) Do not bring valuables to the hospital. [facility] is not responsible for valuables/personal belongings. Put on clean/comfortable clothes.  Please brush your teeth.  Ask your nurse before applying any prescription medications to the skin.

## 2024-08-26 ENCOUNTER — Other Ambulatory Visit: Payer: Self-pay

## 2024-08-26 ENCOUNTER — Encounter (HOSPITAL_COMMUNITY): Payer: Self-pay

## 2024-08-26 ENCOUNTER — Inpatient Hospital Stay (HOSPITAL_COMMUNITY): Admission: RE | Admit: 2024-08-26 | Discharge: 2024-08-26 | Attending: General Surgery

## 2024-08-26 ENCOUNTER — Ambulatory Visit
Admission: RE | Admit: 2024-08-26 | Discharge: 2024-08-26 | Disposition: A | Source: Ambulatory Visit | Attending: Hematology and Oncology

## 2024-08-26 VITALS — BP 141/75 | HR 82 | Temp 98.2°F | Resp 17 | Ht 66.0 in | Wt 129.9 lb

## 2024-08-26 DIAGNOSIS — Z01818 Encounter for other preprocedural examination: Secondary | ICD-10-CM | POA: Insufficient documentation

## 2024-08-26 DIAGNOSIS — Z171 Estrogen receptor negative status [ER-]: Secondary | ICD-10-CM

## 2024-08-26 HISTORY — DX: Prediabetes: R73.03

## 2024-08-26 HISTORY — DX: Malignant (primary) neoplasm, unspecified: C80.1

## 2024-08-26 LAB — BASIC METABOLIC PANEL WITH GFR
Anion gap: 9 (ref 5–15)
BUN: 18 mg/dL (ref 8–23)
CO2: 28 mmol/L (ref 22–32)
Calcium: 9.5 mg/dL (ref 8.9–10.3)
Chloride: 105 mmol/L (ref 98–111)
Creatinine, Ser: 0.8 mg/dL (ref 0.44–1.00)
GFR, Estimated: >60 mL/min
Glucose, Bld: 104 mg/dL — ABNORMAL HIGH (ref 70–99)
Potassium: 4.4 mmol/L (ref 3.5–5.1)
Sodium: 143 mmol/L (ref 135–145)

## 2024-08-26 LAB — CBC
HCT: 43.4 % (ref 36.0–46.0)
Hemoglobin: 14.1 g/dL (ref 12.0–15.0)
MCH: 30.4 pg (ref 26.0–34.0)
MCHC: 32.5 g/dL (ref 30.0–36.0)
MCV: 93.5 fL (ref 80.0–100.0)
Platelets: 301 10*3/uL (ref 150–400)
RBC: 4.64 MIL/uL (ref 3.87–5.11)
RDW: 13.2 % (ref 11.5–15.5)
WBC: 6.5 10*3/uL (ref 4.0–10.5)
nRBC: 0 % (ref 0.0–0.2)

## 2024-08-26 MED ORDER — GADOPICLENOL 0.5 MMOL/ML IV SOLN
6.0000 mL | Freq: Once | INTRAVENOUS | Status: AC | PRN
  Administered 2024-08-26: 6 mL via INTRAVENOUS

## 2024-08-26 NOTE — Progress Notes (Signed)
 PCP - Camie Mirza, PA-C Cardiologist - [facility], formerly [facility] Oncologist: Dr. Amber Stalls  PPM/ICD - denies Device Orders - na Rep Notified - na  Chest x-ray -  EKG - PAT 08/26/2024 Stress Test - 12/30/2013 - CE ECHO - 08/20/2024 Cardiac Cath -   Sleep Study - + OSA CPAP - nightly  Non-diabetic  Blood Thinner Instructions: denies Aspirin  Instructions:denies  ERAS Protcol - Clears until 0715  Anesthesia review: No  Patient denies shortness of breath, fever, cough and chest pain at PAT appointment   All instructions explained to the patient, with a verbal understanding of the material. Patient agrees to go over the instructions while at home for a better understanding. Patient also instructed to self quarantine after being tested for COVID-19. The opportunity to ask questions was provided.

## 2024-08-27 ENCOUNTER — Ambulatory Visit (HOSPITAL_COMMUNITY)

## 2024-08-27 ENCOUNTER — Encounter (HOSPITAL_COMMUNITY): Payer: Self-pay | Admitting: Certified Registered Nurse Anesthetist

## 2024-08-27 ENCOUNTER — Encounter (HOSPITAL_COMMUNITY): Admission: RE | Disposition: A | Payer: Self-pay | Source: Home / Self Care | Attending: General Surgery

## 2024-08-27 ENCOUNTER — Encounter (HOSPITAL_COMMUNITY): Payer: Self-pay | Admitting: General Surgery

## 2024-08-27 ENCOUNTER — Ambulatory Visit (HOSPITAL_COMMUNITY): Admission: RE | Admit: 2024-08-27 | Admitting: General Surgery

## 2024-08-27 ENCOUNTER — Other Ambulatory Visit: Payer: Self-pay

## 2024-08-27 MED ORDER — PROPOFOL 1000 MG/100ML IV EMUL
INTRAVENOUS | Status: AC
  Filled 2024-08-27: qty 100

## 2024-08-27 MED ORDER — CHLORHEXIDINE GLUCONATE CLOTH 2 % EX PADS: 6.0000 | MEDICATED_PAD | Freq: Once | CUTANEOUS | Status: DC

## 2024-08-27 MED ORDER — ROCURONIUM BROMIDE 10 MG/ML (PF) SYRINGE
PREFILLED_SYRINGE | INTRAVENOUS | Status: DC | PRN
  Administered 2024-08-27: 20 mg via INTRAVENOUS

## 2024-08-27 MED ORDER — FENTANYL CITRATE (PF) 100 MCG/2ML IJ SOLN: 25.0000 ug | INTRAMUSCULAR | Status: DC | PRN

## 2024-08-27 MED ORDER — DEXAMETHASONE SOD PHOSPHATE PF 10 MG/ML IJ SOLN
INTRAMUSCULAR | Status: AC
  Filled 2024-08-27: qty 1

## 2024-08-27 MED ORDER — ONDANSETRON HCL 4 MG/2ML IJ SOLN
INTRAMUSCULAR | Status: AC
  Filled 2024-08-27: qty 2

## 2024-08-27 MED ORDER — ACETAMINOPHEN 500 MG PO TABS
1000.0000 mg | ORAL_TABLET | Freq: Once | ORAL | Status: AC
  Administered 2024-08-27: 1000 mg via ORAL
  Filled 2024-08-27: qty 2

## 2024-08-27 MED ORDER — HEPARIN SOD (PORK) LOCK FLUSH 100 UNIT/ML IV SOLN
INTRAVENOUS | Status: DC | PRN
  Administered 2024-08-27: 500 [IU] via INTRAVENOUS

## 2024-08-27 MED ORDER — LIDOCAINE 2% (20 MG/ML) 5 ML SYRINGE
INTRAMUSCULAR | Status: AC
  Filled 2024-08-27: qty 5

## 2024-08-27 MED ORDER — CHLORHEXIDINE GLUCONATE 0.12 % MT SOLN
15.0000 mL | Freq: Once | OROMUCOSAL | Status: AC
  Administered 2024-08-27: 15 mL via OROMUCOSAL
  Filled 2024-08-27: qty 15

## 2024-08-27 MED ORDER — PHENYLEPHRINE 80 MCG/ML (10ML) SYRINGE FOR IV PUSH (FOR BLOOD PRESSURE SUPPORT)
PREFILLED_SYRINGE | INTRAVENOUS | Status: AC
  Filled 2024-08-27: qty 10

## 2024-08-27 MED ORDER — PROPOFOL 10 MG/ML IV BOLUS
INTRAVENOUS | Status: AC
  Filled 2024-08-27: qty 20

## 2024-08-27 MED ORDER — SUGAMMADEX SODIUM 200 MG/2ML IV SOLN
INTRAVENOUS | Status: DC | PRN
  Administered 2024-08-27: 150 mg via INTRAVENOUS

## 2024-08-27 MED ORDER — LIDOCAINE-PRILOCAINE 2.5-2.5 % EX CREA: 1.0000 | TOPICAL_CREAM | CUTANEOUS | 0 refills | Status: AC | PRN

## 2024-08-27 MED ORDER — FENTANYL CITRATE (PF) 250 MCG/5ML IJ SOLN
INTRAMUSCULAR | Status: DC | PRN
  Administered 2024-08-27 (×2): 25 ug via INTRAVENOUS

## 2024-08-27 MED ORDER — VANCOMYCIN HCL IN DEXTROSE 1-5 GM/200ML-% IV SOLN
1000.0000 mg | INTRAVENOUS | Status: AC
  Administered 2024-08-27: 1000 mg via INTRAVENOUS
  Filled 2024-08-27: qty 200

## 2024-08-27 MED ORDER — PROPOFOL 10 MG/ML IV BOLUS
INTRAVENOUS | Status: DC | PRN
  Administered 2024-08-27 (×2): 50 mg via INTRAVENOUS
  Administered 2024-08-27: 100 mg via INTRAVENOUS

## 2024-08-27 MED ORDER — LACTATED RINGERS IV SOLN: INTRAVENOUS | Status: DC

## 2024-08-27 MED ORDER — 0.9 % SODIUM CHLORIDE (POUR BTL) OPTIME
TOPICAL | Status: DC | PRN
  Administered 2024-08-27: 1000 mL

## 2024-08-27 MED ORDER — PHENYLEPHRINE HCL-NACL 20-0.9 MG/250ML-% IV SOLN
INTRAVENOUS | Status: DC | PRN
  Administered 2024-08-27: 25 ug/min via INTRAVENOUS

## 2024-08-27 MED ORDER — BUPIVACAINE HCL (PF) 0.25 % IJ SOLN
INTRAMUSCULAR | Status: AC
  Filled 2024-08-27: qty 30

## 2024-08-27 MED ORDER — BUPIVACAINE HCL (PF) 0.25 % IJ SOLN
INTRAMUSCULAR | Status: DC | PRN
  Administered 2024-08-27: 8 mL

## 2024-08-27 MED ORDER — SUCCINYLCHOLINE CHLORIDE 200 MG/10ML IV SOSY
PREFILLED_SYRINGE | INTRAVENOUS | Status: DC | PRN
  Administered 2024-08-27: 80 mg via INTRAVENOUS

## 2024-08-27 MED ORDER — DEXAMETHASONE SOD PHOSPHATE PF 10 MG/ML IJ SOLN
INTRAMUSCULAR | Status: DC | PRN
  Administered 2024-08-27: 10 mg via INTRAVENOUS

## 2024-08-27 MED ORDER — OXYCODONE HCL 5 MG PO TABS: 5.0000 mg | ORAL_TABLET | Freq: Four times a day (QID) | ORAL | 0 refills | Status: AC | PRN

## 2024-08-27 MED ORDER — LIDOCAINE 2% (20 MG/ML) 5 ML SYRINGE
INTRAMUSCULAR | Status: DC | PRN
  Administered 2024-08-27: 60 mg via INTRAVENOUS

## 2024-08-27 MED ORDER — FENTANYL CITRATE (PF) 100 MCG/2ML IJ SOLN
INTRAMUSCULAR | Status: AC
  Filled 2024-08-27: qty 2

## 2024-08-27 MED ORDER — ONDANSETRON HCL 4 MG/2ML IJ SOLN
INTRAMUSCULAR | Status: DC | PRN
  Administered 2024-08-27: 4 mg via INTRAVENOUS

## 2024-08-27 MED ORDER — HEPARIN 6000 UNIT IRRIGATION SOLUTION
Status: AC
  Filled 2024-08-27: qty 500

## 2024-08-27 MED ORDER — ROCURONIUM BROMIDE 10 MG/ML (PF) SYRINGE
PREFILLED_SYRINGE | INTRAVENOUS | Status: AC
  Filled 2024-08-27: qty 10

## 2024-08-27 MED ORDER — SUCCINYLCHOLINE CHLORIDE 200 MG/10ML IV SOSY
PREFILLED_SYRINGE | INTRAVENOUS | Status: AC
  Filled 2024-08-27: qty 10

## 2024-08-27 MED ORDER — HEPARIN 6000 UNIT IRRIGATION SOLUTION
Status: DC | PRN
  Administered 2024-08-27: 1

## 2024-08-27 MED ORDER — HEPARIN SOD (PORK) LOCK FLUSH 100 UNIT/ML IV SOLN
INTRAVENOUS | Status: AC
  Filled 2024-08-27: qty 5

## 2024-08-27 MED ORDER — ORAL CARE MOUTH RINSE: 15.0000 mL | Freq: Once | OROMUCOSAL | Status: AC

## 2024-08-27 MED ORDER — PROPOFOL 500 MG/50ML IV EMUL
INTRAVENOUS | Status: DC | PRN
  Administered 2024-08-27: 100 ug/kg/min via INTRAVENOUS

## 2024-08-27 NOTE — Anesthesia Procedure Notes (Signed)
 Procedure Name: LMA Insertion Date/Time: 08/27/2024 10:21 AM  Performed by: Delores Dus, CRNAPre-anesthesia Checklist: Patient identified, Emergency Drugs available, Suction available, Patient being monitored and Timeout performed Patient Re-evaluated:Patient Re-evaluated prior to induction Oxygen Delivery Method: Circle system utilized Preoxygenation: Pre-oxygenation with 100% oxygen Induction Type: IV induction LMA Size: 3.0 Number of attempts: 1 Comments: Unable to seat despite multiple attempts. Decision to intubate

## 2024-08-27 NOTE — Anesthesia Postprocedure Evaluation (Signed)
"   Anesthesia Post Note  Patient: [name]  Procedure(s) Performed: INSERTION, TUNNELED CENTRAL VENOUS DEVICE, WITH PORT     Patient location during evaluation: PACU Anesthesia Type: General Level of consciousness: awake and alert Pain management: pain level controlled Vital Signs Assessment: post-procedure vital signs reviewed and stable Respiratory status: spontaneous breathing, nonlabored ventilation and respiratory function stable Cardiovascular status: blood pressure returned to baseline and stable Postop Assessment: no apparent nausea or vomiting Anesthetic complications: no   No notable events documented.  Last Vitals:  Vitals:   08/27/24 1145 08/27/24 1200  BP: 130/86 132/64  Pulse: 72 72  Resp: (!) 23 19  Temp:  36.6 C  SpO2: 98% 94%    Last Pain:  Vitals:   08/27/24 0856  TempSrc:   PainSc: 0-No pain                 Klohe Lovering,W. EDMOND      "

## 2024-08-27 NOTE — Transfer of Care (Signed)
 Immediate Anesthesia Transfer of Care Note  Patient: [name]  Procedure(s) Performed: INSERTION, TUNNELED CENTRAL VENOUS DEVICE, WITH PORT  Patient Location: PACU  Anesthesia Type:General  Level of Consciousness: drowsy  Airway & Oxygen Therapy: Patient Spontanous Breathing and Patient connected to nasal cannula oxygen  Post-op Assessment: Report given to RN and Post -op Vital signs reviewed and stable  Post vital signs: Reviewed and stable  Last Vitals:  Vitals Value Taken Time  BP 135/71 08/27/24 11:30  Temp 36.6 C 08/27/24 11:30  Pulse 70 08/27/24 11:32  Resp 19 08/27/24 11:32  SpO2 100 % 08/27/24 11:32  Vitals shown include unfiled device data.  Last Pain:  Vitals:   08/27/24 0856  TempSrc:   PainSc: 0-No pain         Complications: No notable events documented.

## 2024-08-27 NOTE — H&P (Signed)
 " REFERRING PHYSICIAN: [facility] * PROVIDER: DEWARD GARNETTE NULL, MD MRN: [id number] DOB: June 12, 1946 Subjective   Chief Complaint: New Consultation (triple negative breast cancer with + lymph nodes,)  History of Present Illness: [name] is a 79 y.o. 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 Lauraine Mirza to evaluate her for a new left breast cancer. The patient is a 79 year old white female who recently went for a routine screening mammogram. At that time she was found to have a 1.5 cm mass in the upper inner quadrant of the left breast with multiple abnormal lymph nodes. 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 She is otherwise in good health and does not smoke.  Review of Systems: A complete review of systems was obtained from the patient. I have reviewed this information and discussed as appropriate with the patient. See HPI as well for other ROS.  ROS   Medical History: Past Medical History:  Diagnosis Date  Anxiety  History of cancer  Hyperlipidemia  Hypertension  Sleep apnea   Patient Active Problem List  Diagnosis  Malignant neoplasm of upper-inner quadrant of left breast in female, estrogen receptor negative (CMS/HHS-HCC)   Past Surgical History:  Procedure Laterality Date  COLON SURGERY  HYSTERECTOMY    Allergies  Allergen Reactions  Penicillin Hives   No current outpatient medications on file prior to visit.   No current facility-administered medications on file prior to visit.   Family History  Problem Relation Age of Onset  Breast cancer Mother  High blood pressure (Hypertension) Father  Hyperlipidemia (Elevated cholesterol) Father    Social History   Tobacco Use  Smoking Status Never  Smokeless Tobacco Not  on file    Social History   Socioeconomic History  Marital status: Married  Tobacco Use  Smoking status: Never  Vaping Use  Vaping status: Unknown  Substance and Sexual Activity  Alcohol use: No  Drug use: No   Social Drivers of Corporate Investment Banker Strain: Low Risk (03/27/2024)  Received from [facility]  Overall Financial Resource Strain (CARDIA)  How hard is it for you to pay for the very basics like food, housing, medical care, and heating?: Not hard at all  Food Insecurity: No Food Insecurity (08/13/2024)  Received from [facility]  Hunger Vital Sign  Within the past 12 months, you worried that your food would run out before you got the money to buy more.: Never true  Within the past 12 months, the food you bought just didn't last and you didn't have money to get more.: Never true  Transportation Needs: No Transportation Needs (08/13/2024)  Received from [facility] - Transportation  In the past 12 months, has lack of transportation kept you from medical appointments or from getting medications?: No  In the past 12 months, has lack of transportation kept you from meetings, work, or from getting things needed for daily living?: No  Physical Activity: Insufficiently Active (03/27/2024)  Received from [facility]  Exercise Vital Sign  On average, how many days per week do you engage in moderate to strenuous exercise (like a brisk walk)?: 2 days  On average, how many minutes do you engage in exercise at this level?: 60 min  Stress:  No Stress Concern Present (03/27/2024)  Received from [facility] - Occupational Stress Questionnaire  Do you feel stress - tense, restless, nervous, or anxious, or unable to sleep at night because your mind is troubled all the time - these days?: Not at all  Social Connections: Socially Integrated (03/27/2024)  Received from [facility]  Social Network  How would you rate your social  network (family, work, friends)?: Good participation with social networks  Housing Stability: Unknown (08/18/2024)  Housing Stability Vital Sign  Homeless in the Last Year: No   Objective:   Vitals:  BP: (!) 155/78  Pulse: 98  Resp: 16  Temp: 36.6 C (97.9 F)  SpO2: 98%  Weight: 58.9 kg (129 lb 12.8 oz)  Height: 165.1 cm (5' 5)  PainSc: 3   Body mass index is 21.6 kg/m.  Physical Exam Vitals reviewed.  Constitutional:  General: She is not in acute distress. Appearance: Normal appearance.  HENT:  Head: Normocephalic and atraumatic.  Right Ear: External ear normal.  Left Ear: External ear normal.  Nose: Nose normal.  Mouth/Throat:  Mouth: Mucous membranes are moist.  Pharynx: Oropharynx is clear.  Eyes:  General: No scleral icterus. Extraocular Movements: Extraocular movements intact.  Conjunctiva/sclera: Conjunctivae normal.  Pupils: Pupils are equal, round, and reactive to light.  Cardiovascular:  Rate and Rhythm: Normal rate and regular rhythm.  Pulses: Normal pulses.  Heart sounds: Normal heart sounds.  Pulmonary:  Effort: Pulmonary effort is normal. No respiratory distress.  Breath sounds: Normal breath sounds.  Abdominal:  General: Bowel sounds are normal.  Palpations: Abdomen is soft.  Tenderness: There is no abdominal tenderness.  Musculoskeletal:  General: No swelling, tenderness or deformity. Normal range of motion.  Cervical back: Normal range of motion and neck supple.  Skin: General: Skin is warm and dry.  Coloration: Skin is not jaundiced.  Neurological:  General: No focal deficit present.  Mental Status: She is alert and oriented to person, place, and time.  Psychiatric:  Mood and Affect: Mood normal.  Behavior: Behavior normal.     Breast: There is no palpable mass in either breast. There are 3-4 enlarged palpable mobile lymph nodes in the left axilla.  Labs, Imaging and Diagnostic Testing:  Assessment and Plan:   Diagnoses and all  orders for this visit:  Malignant neoplasm of upper-inner quadrant of left breast in female, estrogen receptor negative (CMS/HHS-HCC) - [facility] Case Posting Request; Future   The patient appears to have a 1.5 cm triple negative breast cancer in the upper inner quadrant of the left breast with 5-6 positive lymph nodes as well as a positive subpectoral lymph node and 1 small level 2 lymph node. I have discussed with her in detail the different options for treatment and at this point I think she would benefit from neoadjuvant chemotherapy. We will plan to place a Port-A-Cath next week so she can begin as soon as possible. I have discussed with her in detail the risks and benefits of the operation as well as some of the technical aspects including the risk of pneumothorax and she understands and wishes to proceed.  "

## 2024-08-27 NOTE — Op Note (Signed)
 08/27/2024 10:17 AM  11:19 AM  PATIENT:  [name]  79 y.o. female  PRE-OPERATIVE DIAGNOSIS:  LEFT BREAST CANCER  POST-OPERATIVE DIAGNOSIS:  LEFT BREAST CANCER  PROCEDURE:  Procedures with comments: INSERTION, TUNNELED CENTRAL VENOUS DEVICE, WITH PORT (N/A) - PORT PLACEMENT WITH ULTRASOUND GUIDANCE  SURGEON:  Surgeons and Role:    * Curvin Deward MOULD, MD - Primary  PHYSICIAN ASSISTANT:   ASSISTANTS: none   ANESTHESIA:   local and general  EBL:  10 mL   BLOOD ADMINISTERED:none  DRAINS: none   LOCAL MEDICATIONS USED:  MARCAINE      SPECIMEN:  No Specimen  DISPOSITION OF SPECIMEN:  N/A  COUNTS:  YES  TOURNIQUET:  * No tourniquets in log *  DICTATION: .Dragon Dictation  After informed consent was obtained the patient was brought to the operating room and placed in the supine position on the operating table.  After adequate induction of general anesthesia a roll was placed between the patient's shoulder blades to extend the shoulder slightly.  The right chest and neck area were then prepped with ChloraPrep, allowed to dry, and draped in usual sterile manner.  An appropriate timeout was performed.  The area lateral to the bend of the clavicle was infiltrated with quarter percent Marcaine .  The patient was placed in Trendelenburg position.  A large bore needle from the Port-A-Cath kit was used to slide beneath the bend of the clavicle heading towards the sternal notch.  I made 2 passes but could not find the subclavian vein.  I then used the ultrasound machine and moved up to the neck.  I was able to identify the right internal jugular vein on ultrasound without difficulty.  The large bore needle from the Port-A-Cath kit was used to access the right internal jugular vein under direct vision with the ultrasound.  A wire was then fed through the needle using the Seldinger technique without difficulty.  The wire was confirmed in the central venous system using real-time fluoroscopy.   Next a small incision was then made on the right chest wall as well as a small incision at the wire entry site on the right neck.  The chest wall incision was carried through the skin and subcutaneous tissue sharply with the electrocautery.  A subcutaneous pocket was created inferior to the incision using blunt finger dissection.  Next the 2 incisions were connected by blunt hemostat dissection.  Tendon passer was placed across the tunnel.  The tendon passer was used to bring the tubing through the tunnel.  The tubing was then placed on the reservoir.  The reservoir was placed in the pocket and the length of the tubing was estimated using real-time fluoroscopy.  The tubing was cut to the appropriate length.  Next a sheath and dilator were fed over the wire using the Seldinger technique without difficulty.  The dilator and wire were removed from the patient.  The tubing was then fed through the sheath as far as it would go and then held in place while the sheath was gently cracked and separated.  Another real-time fluoroscopy image showed the tip of the catheter to be in the distal superior vena cava.  The tubing was then permanently anchored to the reservoir.  The reservoir was anchored in the pocket with two 2-0 Prolene stitches.  The port was then aspirated and it aspirated blood easily.  The port was then flushed initially with a dilute heparin  solution and then with a more concentrated heparin  solution.  The neck incision was closed with interrupted 4-0 Monocryl subcuticular stitch.  The subcutaneous tissue was closed over the port with interrupted 3-0 Vicryl stitches.  The skin was closed with a running 4-0 Monocryl subcuticular stitch.  Dermabond dressings were then applied.  The patient tolerated the procedure well.  At the end of the case all needle sponge and instrument counts were correct.  The patient was then awakened and taken to recovery in stable condition.  PLAN OF CARE: Discharge to home after  PACU  PATIENT DISPOSITION:  PACU - hemodynamically stable.   Delay start of Pharmacological VTE agent (>24hrs) due to surgical blood loss or risk of bleeding: not applicable

## 2024-08-27 NOTE — Anesthesia Procedure Notes (Signed)
 Procedure Name: Intubation Date/Time: 08/27/2024 10:26 AM  Performed by: Delores Dus, CRNAPre-anesthesia Checklist: Patient identified, Emergency Drugs available, Suction available and Patient being monitored Patient Re-evaluated:Patient Re-evaluated prior to induction Oxygen Delivery Method: Circle system utilized Preoxygenation: Pre-oxygenation with 100% oxygen Induction Type: IV induction Ventilation: Mask ventilation without difficulty Laryngoscope Size: Glidescope and 3 Grade View: Grade I Tube type: Oral Tube size: 6.5 mm Number of attempts: 1 Airway Equipment and Method: Stylet and Oral airway Placement Confirmation: ETT inserted through vocal cords under direct vision, positive ETCO2 and breath sounds checked- equal and bilateral Secured at: 21 cm Tube secured with: Tape Dental Injury: Teeth and Oropharynx as per pre-operative assessment

## 2024-08-27 NOTE — Anesthesia Preprocedure Evaluation (Addendum)
"                                    Anesthesia Evaluation  Patient identified by MRN, date of birth, ID band Patient awake    Reviewed: Allergy & Precautions, H&P , NPO status , Patient's Chart, lab work & pertinent test results  Airway Mallampati: IV  TM Distance: >3 FB Neck ROM: Full  Mouth opening: Limited Mouth Opening  Dental no notable dental hx. (+) Teeth Intact, Dental Advisory Given   Pulmonary sleep apnea and Continuous Positive Airway Pressure Ventilation    Pulmonary exam normal breath sounds clear to auscultation       Cardiovascular hypertension, Pt. on medications  Rhythm:Regular Rate:Normal     Neuro/Psych    Depression    negative neurological ROS     GI/Hepatic Neg liver ROS,GERD  Medicated,,  Endo/Other  negative endocrine ROS    Renal/GU negative Renal ROS  negative genitourinary   Musculoskeletal  (+) Arthritis ,    Abdominal   Peds  Hematology negative hematology ROS (+)   Anesthesia Other Findings   Reproductive/Obstetrics negative OB ROS                              Anesthesia Physical Anesthesia Plan  ASA: 3  Anesthesia Plan: General   Post-op Pain Management: Tylenol  PO (pre-op)*   Induction: Intravenous  PONV Risk Score and Plan: 4 or greater and Ondansetron , Dexamethasone , Treatment may vary due to age or medical condition, TIVA and Propofol  infusion  Airway Management Planned: LMA  Additional Equipment:   Intra-op Plan:   Post-operative Plan: Extubation in OR  Informed Consent: I have reviewed the patients History and Physical, chart, labs and discussed the procedure including the risks, benefits and alternatives for the proposed anesthesia with the patient or authorized representative who has indicated his/her understanding and acceptance.     Dental advisory given  Plan Discussed with: CRNA  Anesthesia Plan Comments:          Anesthesia Quick Evaluation  "

## 2024-08-27 NOTE — Interval H&P Note (Signed)
 History and Physical Interval Note:  08/27/2024 9:22 AM  [name]  has presented today for surgery, with the diagnosis of LEFT BREAST CANCER.  The various methods of treatment have been discussed with the patient and family. After consideration of risks, benefits and other options for treatment, the patient has consented to  Procedures with comments: INSERTION, TUNNELED CENTRAL VENOUS DEVICE, WITH PORT (N/A) - PORT PLACEMENT WITH ULTRASOUND GUIDANCE as a surgical intervention.  The patient's history has been reviewed, patient examined, no change in status, stable for surgery.  I have reviewed the patient's chart and labs.  Questions were answered to the patient's satisfaction.     Deward Null III

## 2024-08-28 ENCOUNTER — Inpatient Hospital Stay: Attending: Hematology and Oncology

## 2024-08-28 ENCOUNTER — Encounter (HOSPITAL_COMMUNITY): Payer: Self-pay | Admitting: General Surgery

## 2024-08-28 ENCOUNTER — Inpatient Hospital Stay: Admitting: Licensed Clinical Social Worker

## 2024-08-28 ENCOUNTER — Inpatient Hospital Stay: Attending: Hematology and Oncology | Admitting: Hematology and Oncology

## 2024-08-28 ENCOUNTER — Inpatient Hospital Stay

## 2024-08-28 VITALS — BP 138/64 | HR 81 | Temp 97.6°F | Resp 17 | Ht 66.0 in | Wt 130.3 lb

## 2024-08-28 VITALS — BP 137/70 | HR 77 | Temp 98.3°F | Resp 16

## 2024-08-28 DIAGNOSIS — Z171 Estrogen receptor negative status [ER-]: Secondary | ICD-10-CM

## 2024-08-28 LAB — CBC WITH DIFFERENTIAL (CANCER CENTER ONLY)
Abs Immature Granulocytes: 0.02 10*3/uL (ref 0.00–0.07)
Basophils Absolute: 0 10*3/uL (ref 0.0–0.1)
Basophils Relative: 0 %
Eosinophils Absolute: 0 10*3/uL (ref 0.0–0.5)
Eosinophils Relative: 0 %
HCT: 39.8 % (ref 36.0–46.0)
Hemoglobin: 13.5 g/dL (ref 12.0–15.0)
Immature Granulocytes: 0 %
Lymphocytes Relative: 10 %
Lymphs Abs: 1.1 10*3/uL (ref 0.7–4.0)
MCH: 30.5 pg (ref 26.0–34.0)
MCHC: 33.9 g/dL (ref 30.0–36.0)
MCV: 89.8 fL (ref 80.0–100.0)
Monocytes Absolute: 0.8 10*3/uL (ref 0.1–1.0)
Monocytes Relative: 7 %
Neutro Abs: 9.7 10*3/uL — ABNORMAL HIGH (ref 1.7–7.7)
Neutrophils Relative %: 83 %
Platelet Count: 294 10*3/uL (ref 150–400)
RBC: 4.43 MIL/uL (ref 3.87–5.11)
RDW: 12.9 % (ref 11.5–15.5)
WBC Count: 11.7 10*3/uL — ABNORMAL HIGH (ref 4.0–10.5)
nRBC: 0 % (ref 0.0–0.2)

## 2024-08-28 LAB — CMP (CANCER CENTER ONLY)
ALT: 20 U/L (ref 0–44)
AST: 27 U/L (ref 15–41)
Albumin: 4.4 g/dL (ref 3.5–5.0)
Alkaline Phosphatase: 56 U/L (ref 38–126)
Anion gap: 11 (ref 5–15)
BUN: 11 mg/dL (ref 8–23)
CO2: 24 mmol/L (ref 22–32)
Calcium: 9.4 mg/dL (ref 8.9–10.3)
Chloride: 107 mmol/L (ref 98–111)
Creatinine: 0.65 mg/dL (ref 0.44–1.00)
GFR, Estimated: >60 mL/min
Glucose, Bld: 96 mg/dL (ref 70–99)
Potassium: 3.8 mmol/L (ref 3.5–5.1)
Sodium: 142 mmol/L (ref 135–145)
Total Bilirubin: 0.5 mg/dL (ref 0.0–1.2)
Total Protein: 7.4 g/dL (ref 6.5–8.1)

## 2024-08-28 LAB — TSH: TSH: 0.843 u[IU]/mL (ref 0.350–4.500)

## 2024-08-28 LAB — T4, FREE: Free T4: 1.4 ng/dL (ref 0.80–2.00)

## 2024-08-28 MED ORDER — SODIUM CHLORIDE 0.9 % IV SOLN
102.9000 mg | Freq: Once | INTRAVENOUS | Status: AC
  Administered 2024-08-28: 100 mg via INTRAVENOUS
  Filled 2024-08-28: qty 10

## 2024-08-28 MED ORDER — PALONOSETRON HCL INJECTION 0.25 MG/5ML
0.2500 mg | Freq: Once | INTRAVENOUS | Status: AC
  Administered 2024-08-28: 0.25 mg via INTRAVENOUS
  Filled 2024-08-28: qty 5

## 2024-08-28 MED ORDER — DIPHENHYDRAMINE HCL 50 MG/ML IJ SOLN
50.0000 mg | Freq: Once | INTRAMUSCULAR | Status: AC
  Administered 2024-08-28: 50 mg via INTRAVENOUS
  Filled 2024-08-28: qty 1

## 2024-08-28 MED ORDER — SODIUM CHLORIDE 0.9 % IV SOLN
200.0000 mg | Freq: Once | INTRAVENOUS | Status: AC
  Administered 2024-08-28: 200 mg via INTRAVENOUS
  Filled 2024-08-28: qty 200

## 2024-08-28 MED ORDER — SODIUM CHLORIDE 0.9 % IV SOLN
80.0000 mg/m2 | Freq: Once | INTRAVENOUS | Status: AC
  Administered 2024-08-28: 132 mg via INTRAVENOUS
  Filled 2024-08-28: qty 22

## 2024-08-28 MED ORDER — FAMOTIDINE IN NACL 20-0.9 MG/50ML-% IV SOLN
20.0000 mg | Freq: Once | INTRAVENOUS | Status: AC
  Administered 2024-08-28: 20 mg via INTRAVENOUS
  Filled 2024-08-28: qty 50

## 2024-08-28 MED ORDER — SODIUM CHLORIDE 0.9 % IV SOLN: INTRAVENOUS | Status: DC

## 2024-08-28 MED ORDER — DEXAMETHASONE SOD PHOSPHATE PF 10 MG/ML IJ SOLN
10.0000 mg | Freq: Once | INTRAMUSCULAR | Status: AC
  Administered 2024-08-28: 10 mg via INTRAVENOUS
  Filled 2024-08-28: qty 1

## 2024-08-28 NOTE — Progress Notes (Signed)
 Per MD RN faxed wig order to [facility].

## 2024-08-28 NOTE — Progress Notes (Signed)
 [facility] CONSULT NOTE  Patient Care Team: Jacques Camie Franchot [name] as PCP - General (Physician Assistant) Dann Candyce RAMAN, MD as PCP - Cardiology (Cardiology) Gerome Devere HERO, RN as Oncology Nurse Navigator  CHIEF COMPLAINTS/PURPOSE OF CONSULTATION:  Newly diagnosed breast cancer  HISTORY OF PRESENTING ILLNESS:  [name] 79 y.o. female is here because of recent diagnosis of left breast IDC  I reviewed her records extensively and collaborated the history with the patient.  SUMMARY OF ONCOLOGIC HISTORY: Oncology History  Malignant neoplasm of upper-inner quadrant of left breast in female, estrogen receptor negative (HCC)  08/13/2024 Initial Diagnosis   Malignant neoplasm of upper-inner quadrant of left breast in female, estrogen receptor negative (HCC)   08/28/2024 Cancer Staging   Staging form: Breast, AJCC 8th Edition - Clinical stage from 08/28/2024: Stage IIIC (cT1b, cN2, cM0, G3, ER-, PR-, HER2-) - Signed by Loretha Ash, MD on 08/28/2024 Histologic grading system: 3 grade system   08/28/2024 -  Chemotherapy   Patient is on Treatment Plan : BREAST Pembrolizumab  (200) D1 + Carboplatin  (1.5) D1,8,15 + Paclitaxel  (80) D1,8,15 q21d X 4 cycles / Pembrolizumab  (200) D1 + AC D1 q21d x 4 cycles      Discussed the use of AI scribe software for clinical note transcription with the patient, who gave verbal consent to proceed.  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  Recent MRI on February 4th revealed a 1.1 cm primary tumor with non-mass enhancement spanning approximately 4 cm, and at least seven abnormal left axillary and subpectoral lymph nodes, consistent with clinical stage IIIC (cT1b, cN3, cM0). No abnormalities were identified in the right breast, and there is no evidence of  distant metastasis. She has not received any prior systemic therapy and currently reports no cancer-related symptoms or treatment-related side effects.  She inquired about clinical trial options. She is accompanied by a supportive family member.  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 Discussed diagnosis, staging, and initiated neoadjuvant chemotherapy and immunotherapy plan (pembrolizumab , paclitaxel , carboplatin , filgrastim to be followed by pembrolizumab , doxorubicin, cyclophosphamide, pegfilgrastim). No evidence of distant metastasis by PET scan; MRI and port placement scheduled. Patient physically active and received chemotherapy education, with plan to begin neoadjuvant therapy within the following week.  Rest of the pertinent 10 point ROS reviewed and neg.  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   CT CTA CORONARY W/CA SCORE W/CM &/OR WO/CM  06/18/2018   no further testing    NASAL SEPTUM SURGERY     NASOPHARYNGEAL STENOSIS REPAIR  tohelp with snoring    SOCIAL HISTORY: Social History   Socioeconomic History   Marital status: Married    Spouse name: Not on file   Number of children: Not on file   Years of education: Not on file   Highest education level: Not on file  Occupational History   Not on file  Tobacco Use    Smoking status: Never   Smokeless tobacco: Never  Vaping Use   Vaping status: Never Used  Substance and Sexual Activity   Alcohol use: Not Currently    Comment: quit 5 years ago   Drug use: Never   Sexual activity: Not on file  Other Topics Concern   Not on file  Social History Narrative   Not on file   Social Drivers of Health   Tobacco Use: Low Risk (08/27/2024)   Patient History    Smoking Tobacco Use: Never    Smokeless Tobacco Use: Never    Passive Exposure: Not on file  Financial Resource Strain: Low Risk (03/27/2024)   Received from [facility]   Overall Financial Resource Strain (CARDIA)    How hard is it for you to pay for the very basics like food, housing, medical care, and heating?: Not hard at all  Food Insecurity: No Food Insecurity (08/13/2024)   Epic    Worried About Radiation Protection Practitioner of Food in the Last Year: Never true    Ran Out of Food in the Last Year: Never true  Transportation Needs: No Transportation Needs (08/13/2024)   Epic    Lack of Transportation (Medical): No    Lack of Transportation (Non-Medical): No  Physical Activity: Insufficiently Active (03/27/2024)   Received from [facility]   Exercise Vital Sign    On average, how many days per week do you engage in moderate to strenuous exercise (like a brisk walk)?: 2 days    On average, how many minutes do you engage in exercise at this level?: 60 min  Stress: No Stress Concern Present (03/27/2024)   Received from [facility] - Occupational Stress Questionnaire    Do you feel stress - tense, restless, nervous, or anxious, or unable to sleep at night because your mind is troubled all the time - these days?: Not at all  Social Connections: Socially Integrated (03/27/2024)   Received from [facility]   Social Network    How would you rate your social network (family, work, friends)?: Good participation with social networks  Intimate Partner Violence: Not At Risk  (03/27/2024)   Received from [facility]   HITS    Over the last 12 months how often did your partner physically hurt you?: Never    Over the last 12 months how often did your partner insult you or talk down to you?: Sometimes    Over the last 12 months how often did your partner threaten you with physical harm?: Never    Over the last 12 months how often did your partner scream or curse at you?: Never  Depression (PHQ2-9): Low Risk (08/28/2024)   Depression (PHQ2-9)    PHQ-2 Score: 0  Alcohol Screen: Not on file  Housing: Unknown (08/18/2024)   Received from [facility]   Epic    Unable to Pay for Housing in the Last Year: Not on file    Number of Times Moved in the Last Year: Not on file    At any time in the past 12  months, were you homeless or living in a shelter (including now)?: No  Utilities: Not At Risk (08/13/2024)   Epic    Threatened with loss of utilities: No  Health Literacy: Not on file    FAMILY HISTORY: Family History  Problem Relation Age of Onset   Liver disease Mother    Alcohol abuse Mother    Heart attack Father    Breast cancer Sister    Colon cancer Neg Hx    Colon polyps Neg Hx    Esophageal cancer Neg Hx    Rectal cancer Neg Hx    Stomach cancer Neg Hx     ALLERGIES:  is allergic to penicillins.  MEDICATIONS:  Current Outpatient Medications  Medication Sig Dispense Refill   alendronate (FOSAMAX) 70 MG tablet Take 70 mg by mouth every 7 (seven) days.     B Complex-C (SUPER B COMPLEX PO) Take 1 tablet by mouth daily.     Cholecalciferol (VITAMIN D3) 50 MCG (2000 UT) TABS Take 2,000 Units by mouth daily.     CO-ENZYME Q-10 PO Take 300 mg by mouth daily.     cycloSPORINE (RESTASIS) 0.05 % ophthalmic emulsion Place 1 drop into both eyes 2 (two) times daily.     dexamethasone  (DECADRON ) 4 MG tablet Take 2 tablets daily for 2 days, start the day after chemotherapy. Take with food. 30 tablet 1   fluticasone (FLONASE) 50 MCG/ACT nasal  spray Place 2 sprays into both nostrils daily.     Krill Oil 500 MG CAPS Take 500 mg by mouth daily.     lidocaine -prilocaine  (EMLA ) cream Apply to affected area once 30 g 3   lidocaine -prilocaine  (EMLA ) cream Apply 1 Application topically as needed. Apply to port site before coming in for chemo 30 g 0   MAGNESIUM CITRATE PO Take 250 mg by mouth daily.     Menaquinone-7 (K2 PO) Take 20 mcg by mouth daily.     Multiple Vitamin (MULTIVITAMIN) tablet Take 1 tablet by mouth daily.     ondansetron  (ZOFRAN ) 8 MG tablet Take 1 tablet (8 mg total) by mouth every 8 (eight) hours as needed for nausea or vomiting. Start on the third day after chemotherapy. 30 tablet 1   oxyCODONE  (ROXICODONE ) 5 MG immediate release tablet Take 1 tablet (5 mg total) by mouth every 6 (six) hours as needed. 5 tablet 0   Probiotic Product (PROBIOTIC PO) Take 1 capsule by mouth daily.     prochlorperazine  (COMPAZINE ) 10 MG tablet Take 1 tablet (10 mg total) by mouth every 6 (six) hours as needed for nausea or vomiting. 30 tablet 1   rosuvastatin  (CRESTOR ) 20 MG tablet Take 20 mg by mouth at bedtime.      zinc gluconate 50 MG tablet Take 50 mg by mouth daily.     No current facility-administered medications for this visit.   Facility-Administered Medications Ordered in Other Visits  Medication Dose Route Frequency Provider Last Rate Last Admin   0.9 %  sodium chloride  infusion   Intravenous Continuous Genova Kiner, MD 10 mL/hr at 08/28/24 0917 New Bag at 08/28/24 0917   CARBOplatin  (PARAPLATIN ) 100 mg in sodium chloride  0.9 % 100 mL chemo infusion  100 mg Intravenous Once Jamarrius Salay, MD       famotidine  (PEPCID ) IVPB 20 mg premix  20 mg Intravenous Once Rahkim Rabalais, MD       PACLitaxel  (TAXOL ) 132 mg in sodium chloride  0.9 % 250 mL chemo infusion (</= 80mg /m2)  80  mg/m2 (Treatment Plan Recorded) Intravenous Once Donise Woodle, MD       pembrolizumab  (KEYTRUDA ) 200 mg in sodium chloride  0.9 % 50 mL chemo infusion   200 mg Intravenous Once Amneet Cendejas, MD       All other systems were reviewed with the patient and are negative.  PHYSICAL EXAMINATION: ECOG PERFORMANCE STATUS: 0 - Asymptomatic  Vitals:   08/28/24 0845  BP: 138/64  Pulse: 81  Resp: 17  Temp: 97.6 F (36.4 C)  SpO2: 96%    Filed Weights   08/28/24 0845  Weight: 130 lb 4.8 oz (59.1 kg)     GENERAL:alert, no distress and comfortable   LABORATORY DATA:  I have reviewed the data as listed Lab Results  Component Value Date   WBC 11.7 (H) 08/28/2024   HGB 13.5 08/28/2024   HCT 39.8 08/28/2024   MCV 89.8 08/28/2024   PLT 294 08/28/2024   Lab Results  Component Value Date   NA 142 08/28/2024   K 3.8 08/28/2024   CL 107 08/28/2024   CO2 24 08/28/2024    RADIOGRAPHIC STUDIES: I have personally reviewed the radiological reports and agreed with the findings in the report.  ASSESSMENT AND PLAN:   Assessment and Plan Assessment & Plan  Stage IIIC estrogen receptor negative locally advanced left breast cancer Newly upstaged, high-grade, triple-negative breast cancer with extensive nodal involvement and non-mass enhancement. No distant metastases.  Keynote 522 regimen best first-line option. Clinical trial not recommended due to urgency and lack of superior alternatives. - Initiated neoadjuvant chemotherapy and immunotherapy per Keynote 522 regimen. - Instructed her to use loratadine as needed for allergy symptoms and to mitigate potential side effects of growth factor injections. - Scheduled visits next week for chemotherapy infusion and supportive injections as per treatment calendar. - Discussed risks of immediate and delayed chemotherapy/immunotherapy reactions and fatigue; provided anticipatory guidance regarding side effects and need for ongoing monitoring. - Will reassess disease response with MRI after completion of neoadjuvant therapy (~6 months). - Surgical intervention (lumpectomy or mastectomy) planned  after neoadjuvant therapy, with extent determined by response and surgical evaluation; some nodal areas may not be surgically accessible. - Post-surgical radiation therapy planned to address areas not amenable to surgical resection. - Will consider continuation of immunotherapy maintenance after surgery and radiation, depending on response and final pathology.   All questions were answered. The patient knows to call the clinic with any problems, questions or concerns.    Amber Stalls, MD 08/28/24

## 2024-08-28 NOTE — Progress Notes (Signed)
 Nutrition Assessment   Reason for Assessment:  Questions regarding food, supplements to eat during treatment   ASSESSMENT:  79 year old female with triple negative breast cancer.  Past medical history of GERD, HTN, osteoporosis.  Patient receiving keytruda , carboplatin , taxol   Met with patient during infusion.  Reports that she has a good/normal appetite.  Has questions and concerns about foods to eat during treatment     Medications: probiotic, compazine , zofran , MVI, krill oil, coq10, zinc, Vit D, Vit k   Labs: reviewed   Anthropometrics:   Height: 65 inches Weight: 130 lb  Stable weight BMI: 21   Estimated Energy Needs  Kcals: 1475-1700 Protein: 70-88 g Fluid: > 1475 ml   NUTRITION DIAGNOSIS: Food and nutrition related knowledge deficit related to new diagnosis of cancer and requesting to speak RD   INTERVENTION:  Answered questions regarding food safety. Handout provided Discussed recommendations from [employer].  Handout provided Recommend patient continue MVI daily and calcium  and Vit D as with osteoporosis.  Unsure what Vit D level is and encouraged this to be checked yearly by PCP.   Contact information provided   MONITORING, EVALUATION, GOAL: weight trends, intake   Next Visit: Feb 20 during infusion  [name] B. Dasie SOLON, CSO, LDN Registered Dietitian [phone]

## 2024-08-28 NOTE — Patient Instructions (Addendum)
 [facility] - A DEPT OF [facility]  Discharge Instructions: Thank you for choosing [facility] to provide your oncology and hematology care.   If you have a lab appointment with the Cancer Center, please go directly to the Cancer Center and check in at the registration area.   Wear comfortable clothing and clothing appropriate for easy access to any Portacath or PICC line.   We strive to give you quality time with your provider. You may need to reschedule your appointment if you arrive late (15 or more minutes).  Arriving late affects you and other patients whose appointments are after yours.  Also, if you miss three or more appointments without notifying the office, you may be dismissed from the clinic at the providers discretion.      For prescription refill requests, have your pharmacy contact our office and allow 72 hours for refills to be completed.    Today you received the following chemotherapy and/or immunotherapy agents Keytruda , Taxol , and Carboplatin       To help prevent nausea and vomiting after your treatment, we encourage you to take your nausea medication as directed.  BELOW ARE SYMPTOMS THAT SHOULD BE REPORTED IMMEDIATELY: *FEVER GREATER THAN 100.4 F (38 C) OR HIGHER *CHILLS OR SWEATING *NAUSEA AND VOMITING THAT IS NOT CONTROLLED WITH YOUR NAUSEA MEDICATION *UNUSUAL SHORTNESS OF BREATH *UNUSUAL BRUISING OR BLEEDING *URINARY PROBLEMS (pain or burning when urinating, or frequent urination) *BOWEL PROBLEMS (unusual diarrhea, constipation, pain near the anus) TENDERNESS IN MOUTH AND THROAT WITH OR WITHOUT PRESENCE OF ULCERS (sore throat, sores in mouth, or a toothache) UNUSUAL RASH, SWELLING OR PAIN  UNUSUAL VAGINAL DISCHARGE OR ITCHING   Items with * indicate a potential emergency and should be followed up as soon as possible or go to the Emergency Department if any problems should occur.  Please show the CHEMOTHERAPY ALERT  CARD or IMMUNOTHERAPY ALERT CARD at check-in to the Emergency Department and triage nurse.  Should you have questions after your visit or need to cancel or reschedule your appointment, please contact [facility]  Dept: [phone]  and follow the prompts.  Office hours are 8:00 a.m. to 4:30 p.m. Monday - Friday. Please note that voicemails left after 4:00 p.m. may not be returned until the following business day.  We are closed weekends and major holidays. You have access to a nurse at all times for urgent questions. Please call the main number to the clinic Dept: [phone] and follow the prompts.   For any non-urgent questions, you may also contact your provider using MyChart. We now offer e-Visits for anyone 79 and older to request care online for non-urgent symptoms. For details visit [email or web address].   Also download the MyChart app! Go to the app store, search MyChart, open the app, select [facility], and log in with your MyChart username and password.  Pembrolizumab  Injection What is this medication? PEMBROLIZUMAB  (PEM broe LIZ ue mab) treats some types of cancer. It works by helping your immune system slow or stop the spread of cancer cells. It is a monoclonal antibody. This medicine may be used for other purposes; ask your health care provider or pharmacist if you have questions. COMMON BRAND NAME(S): Keytruda  What should I tell my care team before I take this medication? They need to know if you have any of these conditions: Autoimmune conditions, such as Crohn disease,  ulcerative colitis, lupus Have had radiation therapy to your chest area Have received or plan to receive a stem cell transplant that uses donor stem cells (allogeneic) Nervous system problems, such as Guillain-Barre syndrome or myasthenia gravis Organ or tissue transplant An unusual or allergic reaction to pembrolizumab , other medications, foods, dyes,  or preservatives Pregnant or trying to get pregnant Breastfeeding How should I use this medication? This medication is injected into a vein. It is given by your care team in a hospital or clinic setting. A special MedGuide will be given to you before each treatment. Be sure to read this information carefully each time. Talk to your care team about the use of this medication in children. While it may be prescribed for children as young as 6 months for selected conditions, precautions do apply. Overdosage: If you think you have taken too much of this medicine contact a poison control center or emergency room at once. NOTE: This medicine is only for you. Do not share this medicine with others. What if I miss a dose? Keep appointments for follow-up doses. It is important not to miss your dose. Call your care team if you are unable to keep an appointment. What may interact with this medication? Interactions have not been studied. This list may not describe all possible interactions. Give your health care provider a list of all the medicines, herbs, non-prescription drugs, or dietary supplements you use. Also tell them if you smoke, drink alcohol, or use illegal drugs. Some items may interact with your medicine. What should I watch for while using this medication? Your condition will be monitored carefully while you are receiving this medication. You may need blood work while taking this medication. This medication may cause serious skin reactions. They can happen weeks to months after starting the medication. Contact your care team right away if you notice fevers or flu-like symptoms with a rash. The rash may be red or purple and then turn into blisters or peeling of the skin. You may also notice a red rash with swelling of the face, lips, or lymph nodes in your neck or under your arms. Tell your care team right away if you have any change in your eyesight. Talk to your care team if you may be pregnant.  Serious birth defects can occur if you take this medication during pregnancy and for 4 months after the last dose. You will need a negative pregnancy test before starting this medication. Contraception is recommended while taking this medication and for 4 months after the last dose. Your care team can help you find the option that works for you. Do not breastfeed while taking this medication and for 4 months after the last dose. What side effects may I notice from receiving this medication? Side effects that you should report to your care team as soon as possible: Allergic reactions--skin rash, itching, hives, swelling of the face, lips, tongue, or throat Dry cough, shortness of breath or trouble breathing Eye pain, redness, irritation, or discharge with blurry or decreased vision Heart muscle inflammation--unusual weakness or fatigue, shortness of breath, chest pain, fast or irregular heartbeat, dizziness, swelling of the ankles, feet, or hands Hormone gland problems--headache, sensitivity to light, unusual weakness or fatigue, dizziness, fast or irregular heartbeat, increased sensitivity to cold or heat, excessive sweating, constipation, hair loss, increased thirst or amount of urine, tremors or shaking, irritability Infusion reactions--chest pain, shortness of breath or trouble breathing, feeling faint or lightheaded Kidney injury (glomerulonephritis)--decrease in the amount of  urine, red or dark brown urine, foamy or bubbly urine, swelling of the ankles, hands, or feet Liver injury--right upper belly pain, loss of appetite, nausea, light-colored stool, dark yellow or brown urine, yellowing skin or eyes, unusual weakness or fatigue Pain, tingling, or numbness in the hands or feet, muscle weakness, change in vision, confusion or trouble speaking, loss of balance or coordination, trouble walking, seizures Rash, fever, and swollen lymph nodes Redness, blistering, peeling, or loosening of the skin,  including inside the mouth Sudden or severe stomach pain, bloody diarrhea, fever, nausea, vomiting Side effects that usually do not require medical attention (report to your care team if they continue or are bothersome): Bone, joint, or muscle pain Diarrhea Fatigue Loss of appetite Nausea Skin rash This list may not describe all possible side effects. Call your doctor for medical advice about side effects. You may report side effects to FDA at 1-800-FDA-1088. Where should I keep my medication? This medication is given in a hospital or clinic. It will not be stored at home. NOTE: This sheet is a summary. It may not cover all possible information. If you have questions about this medicine, talk to your doctor, pharmacist, or health care provider.  2025 Elsevier/Gold Standard (2024-05-11 00:00:00)  Paclitaxel  Injection What is this medication? PACLITAXEL  (PAK li TAX el) treats some types of cancer. It works by slowing down the growth of cancer cells. This medicine may be used for other purposes; ask your health care provider or pharmacist if you have questions. COMMON BRAND NAME(S): Onxol, Taxol  What should I tell my care team before I take this medication? They need to know if you have any of these conditions: Heart disease Liver disease Low white blood cell levels An unusual or allergic reaction to paclitaxel , other medications, foods, dyes, or preservatives If you or your partner are pregnant or trying to get pregnant Breast-feeding How should I use this medication? This medication is infused into a vein. It is given by your care team in a hospital or clinic setting. Talk to your care team about the use of this medication in children. Special care may be needed. Overdosage: If you think you have taken too much of this medicine contact a poison control center or emergency room at once. NOTE: This medicine is only for you. Do not share this medicine with others. What if I miss a  dose? Keep appointments for follow-up doses. It is important not to miss your dose. Call your care team if you are unable to keep an appointment. What may interact with this medication? Do not take this medication with any of the following: Live virus vaccines Other medications may affect the way this medication works. Talk with your care team about all of the medications you take. They may suggest changes to your treatment plan to lower the risk of side effects and to make sure your medications work as intended. This list may not describe all possible interactions. Give your health care provider a list of all the medicines, herbs, non-prescription drugs, or dietary supplements you use. Also tell them if you smoke, drink alcohol, or use illegal drugs. Some items may interact with your medicine. What should I watch for while using this medication? Your condition will be monitored carefully while you are receiving this medication. You may need blood work while taking this medication. This medication may make you feel generally unwell. This is not uncommon as chemotherapy can affect healthy cells as well as cancer cells. Report any side  effects. Continue your course of treatment even though you feel ill unless your care team tells you to stop. This medication can cause serious allergic reactions. To reduce the risk, your care team may give you other medications to take before receiving this one. Be sure to follow the directions from your care team. This medication may increase your risk of getting an infection. Call your care team for advice if you get a fever, chills, sore throat, or other symptoms of a cold or flu. Do not treat yourself. Try to avoid being around people who are sick. This medication may increase your risk to bruise or bleed. Call your care team if you notice any unusual bleeding. Be careful brushing or flossing your teeth or using a toothpick because you may get an infection or bleed  more easily. If you have any dental work done, tell your dentist you are receiving this medication. Talk to your care team if you may be pregnant. Serious birth defects can occur if you take this medication during pregnancy. Talk to your care team before breastfeeding. Changes to your treatment plan may be needed. What side effects may I notice from receiving this medication? Side effects that you should report to your care team as soon as possible: Allergic reactions--skin rash, itching, hives, swelling of the face, lips, tongue, or throat Heart rhythm changes--fast or irregular heartbeat, dizziness, feeling faint or lightheaded, chest pain, trouble breathing Increase in blood pressure Infection--fever, chills, cough, sore throat, wounds that don't heal, pain or trouble when passing urine, general feeling of discomfort or being unwell Low blood pressure--dizziness, feeling faint or lightheaded, blurry vision Low red blood cell level--unusual weakness or fatigue, dizziness, headache, trouble breathing Painful swelling, warmth, or redness of the skin, blisters or sores at the infusion site Pain, tingling, or numbness in the hands or feet Slow heartbeat--dizziness, feeling faint or lightheaded, confusion, trouble breathing, unusual weakness or fatigue Unusual bruising or bleeding Side effects that usually do not require medical attention (report to your care team if they continue or are bothersome): Diarrhea Hair loss Joint pain Loss of appetite Muscle pain Nausea Vomiting This list may not describe all possible side effects. Call your doctor for medical advice about side effects. You may report side effects to FDA at 1-800-FDA-1088. Where should I keep my medication? This medication is given in a hospital or clinic. It will not be stored at home. NOTE: This sheet is a summary. It may not cover all possible information. If you have questions about this medicine, talk to your doctor,  pharmacist, or health care provider.  2025 Elsevier/Gold Standard (2024-05-14 00:00:00)  Carboplatin  Injection What is this medication? CARBOPLATIN  (KAR boe pla tin) treats some types of cancer. It works by slowing down the growth of cancer cells. This medicine may be used for other purposes; ask your health care provider or pharmacist if you have questions. COMMON BRAND NAME(S): Paraplatin  What should I tell my care team before I take this medication? They need to know if you have any of these conditions: Blood disorders Hearing problems Kidney disease Recent or ongoing radiation therapy An unusual or allergic reaction to carboplatin , cisplatin, other medications, foods, dyes, or preservatives Pregnant or trying to get pregnant Breast-feeding How should I use this medication? This medication is injected into a vein. It is given by your care team in a hospital or clinic setting. Talk to your care team about the use of this medication in children. Special care may be needed. Overdosage:  If you think you have taken too much of this medicine contact a poison control center or emergency room at once. NOTE: This medicine is only for you. Do not share this medicine with others. What if I miss a dose? Keep appointments for follow-up doses. It is important not to miss your dose. Call your care team if you are unable to keep an appointment. What may interact with this medication? Medications for seizures Some antibiotics, such as amikacin, gentamicin, neomycin, streptomycin, tobramycin Vaccines This list may not describe all possible interactions. Give your health care provider a list of all the medicines, herbs, non-prescription drugs, or dietary supplements you use. Also tell them if you smoke, drink alcohol, or use illegal drugs. Some items may interact with your medicine. What should I watch for while using this medication? Your condition will be monitored carefully while you are receiving  this medication. You may need blood work while taking this medication. This medication may make you feel generally unwell. This is not uncommon, as chemotherapy can affect healthy cells as well as cancer cells. Report any side effects. Continue your course of treatment even though you feel ill unless your care team tells you to stop. In some cases, you may be given additional medications to help with side effects. Follow all directions for their use. This medication may increase your risk of getting an infection. Call your care team for advice if you get a fever, chills, sore throat, or other symptoms of a cold or flu. Do not treat yourself. Try to avoid being around people who are sick. Avoid taking medications that contain aspirin , acetaminophen , ibuprofen, naproxen, or ketoprofen unless instructed by your care team. These medications may hide a fever. Be careful brushing or flossing your teeth or using a toothpick because you may get an infection or bleed more easily. If you have any dental work done, tell your dentist you are receiving this medication. Talk to your care team if you wish to become pregnant or think you might be pregnant. This medication can cause serious birth defects. Talk to your care team about effective forms of contraception. Do not breast-feed while taking this medication. What side effects may I notice from receiving this medication? Side effects that you should report to your care team as soon as possible: Allergic reactions--skin rash, itching, hives, swelling of the face, lips, tongue, or throat Infection--fever, chills, cough, sore throat, wounds that don't heal, pain or trouble when passing urine, general feeling of discomfort or being unwell Low red blood cell level--unusual weakness or fatigue, dizziness, headache, trouble breathing Pain, tingling, or numbness in the hands or feet, muscle weakness, change in vision, confusion or trouble speaking, loss of balance or  coordination, trouble walking, seizures Unusual bruising or bleeding Side effects that usually do not require medical attention (report to your care team if they continue or are bothersome): Hair loss Nausea Unusual weakness or fatigue Vomiting This list may not describe all possible side effects. Call your doctor for medical advice about side effects. You may report side effects to FDA at 1-800-FDA-1088. Where should I keep my medication? This medication is given in a hospital or clinic. It will not be stored at home. NOTE: This sheet is a summary. It may not cover all possible information. If you have questions about this medicine, talk to your doctor, pharmacist, or health care provider.  2024 Elsevier/Gold Standard (2021-10-31 00:00:00)

## 2024-08-28 NOTE — Progress Notes (Addendum)
 [facility] Clinical Social Work  Initial Assessment   [name] is a 79 y.o. year old female presenting alone. Clinical Social Work was referred by new patient protocol for assessment of psychosocial needs.   SDOH (Social Determinants of Health) assessments performed: Yes SDOH Interventions    Flowsheet Row Clinical Support from 08/28/2024 in [facility] - A Dept Of [facility]  SDOH Interventions   Food Insecurity Interventions Intervention Not Indicated  Housing Interventions Intervention Not Indicated  Transportation Interventions Intervention Not Indicated  Utilities Interventions Intervention Not Indicated    SDOH Screenings   Food Insecurity: No Food Insecurity (08/28/2024)  Housing: Low Risk (08/28/2024)  Transportation Needs: No Transportation Needs (08/28/2024)  Utilities: Not At Risk (08/28/2024)  Depression (PHQ2-9): Low Risk (08/28/2024)  Financial Resource Strain: Low Risk (03/27/2024)   Received from [facility]  Physical Activity: Insufficiently Active (03/27/2024)   Received from [facility]  Social Connections: Socially Integrated (03/27/2024)   Received from [facility]  Stress: No Stress Concern Present (03/27/2024)   Received from [facility]  Tobacco Use: Low Risk (08/27/2024)    PHQ 2/9:    08/28/2024   11:14 AM 08/28/2024    9:06 AM  Depression screen PHQ 2/9  Decreased Interest 0 0  Down, Depressed, Hopeless 0 0  PHQ - 2 Score 0 0     Distress Screen completed: Yes    08/20/2024    2:15 PM  ONCBCN DISTRESS SCREENING  Screening Type Initial Screening  How much distress have you been experiencing in the past week? (0-10) 9  Emotional concerns type Worry or anxiety;Fear;Changes in appearance  Physical Concerns Type  Sleep;Memory or concentration;Loss or change of physical abilities      Family/Social Information:  Housing Arrangement: patient lives with her spouse Family members/support persons in your life? Family and  Friends Transportation concerns: no  Employment: Retired.  Income source: Actor concerns: No Type of concern: None Food access concerns: no Religious or spiritual practice: Yes-has many people praying for her Advanced directives: Scientist, Research (physical Sciences) Currently in place:  Medicare + BCBS supplement  Coping/ Adjustment to diagnosis: Patient understands treatment plan and what happens next? yes, she is starting chemo today. She has not had much time to process yet as it has gone quickly from diagnosis to treatment. Pt does acknowledge fear and grief. She is wanting to make sure she stays engaged with things she enjoys and does not fall into depression Concerns about diagnosis and/or treatment: Feelings of anger or sadness, Overwhelmed by information, and Quality of life Patient reported stressors: Anxiety/ nervousness and Adjusting to my illness Hopes and/or priorities: wants to stay engaged in her life Patient enjoys gardening, crafts, reading, and time with family/ friends Current coping skills/ strengths: Capable of independent living , Motivation for treatment/growth , Special hobby/interest , and Supportive family/friends     SUMMARY: Current SDOH Barriers:  No major SDOH barriers. General adjustment to cancer concerns  Clinical Social Work Clinical Goal(s):  Increase ability to adjust to current life circumstances related to cancer diagnosis  Interventions: Discussed common feeling and emotions when being diagnosed with cancer, and the importance of support during treatment Informed patient of the support team roles and support services at [facility] Provided CSW contact information and encouraged patient to call with any questions or concerns Referred for peer mentor/ Alight guide Message sent to MD re: pt's request for wig prescription Provided information on [facility] for potential wig help   Follow Up Plan: CSW will see patient on 2/12  during next treatment Patient verbalizes understanding of plan: Yes    Vieva Brummitt E Brendan Gruwell, LCSW Clinical Social Worker [facility]

## 2024-09-02 ENCOUNTER — Inpatient Hospital Stay: Admitting: Physician Assistant

## 2024-09-02 ENCOUNTER — Inpatient Hospital Stay

## 2024-09-03 ENCOUNTER — Inpatient Hospital Stay: Admitting: Licensed Clinical Social Worker

## 2024-09-03 ENCOUNTER — Inpatient Hospital Stay

## 2024-09-11 ENCOUNTER — Inpatient Hospital Stay

## 2024-09-11 ENCOUNTER — Inpatient Hospital Stay: Admitting: Adult Health

## 2024-09-12 ENCOUNTER — Inpatient Hospital Stay

## 2024-09-14 ENCOUNTER — Inpatient Hospital Stay

## 2024-09-15 ENCOUNTER — Inpatient Hospital Stay
